# Patient Record
Sex: Female | Born: 1980 | Race: White | Hispanic: No | Marital: Married | Smoking: Never smoker
Health system: Southern US, Community
[De-identification: ages and names within clinical notes are randomized; demographics above are authoritative.]

## PROBLEM LIST (undated history)

## (undated) DIAGNOSIS — B999 Unspecified infectious disease: Secondary | ICD-10-CM

## (undated) DIAGNOSIS — A491 Streptococcal infection, unspecified site: Secondary | ICD-10-CM

## (undated) DIAGNOSIS — R51 Headache: Secondary | ICD-10-CM

## (undated) HISTORY — DX: Unspecified infectious disease: B99.9

## (undated) HISTORY — DX: Headache: R51

## (undated) HISTORY — DX: Streptococcal infection, unspecified site: A49.1

---

## 2010-03-30 HISTORY — PX: WISDOM TOOTH EXTRACTION: SHX21

## 2012-01-01 ENCOUNTER — Ambulatory Visit (INDEPENDENT_AMBULATORY_CARE_PROVIDER_SITE_OTHER): Admitting: Obstetrics and Gynecology

## 2012-01-01 ENCOUNTER — Encounter: Payer: Self-pay | Admitting: Obstetrics and Gynecology

## 2012-01-01 VITALS — BP 104/80 | HR 76 | Ht 63.0 in | Wt 129.0 lb

## 2012-01-01 DIAGNOSIS — O26859 Spotting complicating pregnancy, unspecified trimester: Secondary | ICD-10-CM

## 2012-01-01 DIAGNOSIS — O26851 Spotting complicating pregnancy, first trimester: Secondary | ICD-10-CM

## 2012-01-01 DIAGNOSIS — Z331 Pregnant state, incidental: Secondary | ICD-10-CM

## 2012-01-01 DIAGNOSIS — O209 Hemorrhage in early pregnancy, unspecified: Secondary | ICD-10-CM

## 2012-01-01 LAB — POCT URINALYSIS DIPSTICK
Bilirubin, UA: NEGATIVE
Blood, UA: NEGATIVE
Glucose, UA: NEGATIVE
Nitrite, UA: NEGATIVE
Spec Grav, UA: 1.015
Urobilinogen, UA: NEGATIVE

## 2012-01-01 LAB — POCT URINE PREGNANCY: Preg Test, Ur: POSITIVE

## 2012-01-01 NOTE — Progress Notes (Signed)
31 YO 10w 6 d pregnant by dates presented for interview today and gave a report of bleeding a few weeks ago then again last week.  Bled x 3 days each time requiring pad change daily but denies cramps.  Denies dyspareunia, urinary tract or  bowel symptoms. Blood Type A +.   O: Abdomen: soft, non-tender,  FHR=160 bpm      Pelvic:patient refuses until after she has an ultrasound  A:  Bleeding in early pregnancy      Blood type A +  P:  OB U/S < 14 weeks       Prenatal panel-pending        Nothing in vagina until there has been no bleeding x 7 days       RTO- 01/08/12 NOB and U/S if possible  Audrey Acoff, PA-C

## 2012-01-01 NOTE — Progress Notes (Signed)
When did bleeding start: 3 weeks ago How  Long: on/off within the three weeks, pt is  not bleeding today. How often changing pad/tampon: whe pt did have bleeding have bleeding it was once a day. Bleeding Disorders: no Cramping: no Contraception: no Fibroids: no Hormone Therapy: no New Medications: no Menopausal Symptoms: no Vag. Discharge: yes Abdominal Pain: no Increased Stress: yes

## 2012-01-01 NOTE — Progress Notes (Signed)
NOB interview completed.  Pt states for past 3 wks has noticed spotting/bleeding off/on.  Last instance was this past weekend, denies any spotting/bleeding today and denies any pain/cramping.  Pt scheduled for eval today w/ EP.  Quantitative HCG added to pt's prenatal labs today per EP.  Pt will return @ 2pm.

## 2012-01-02 LAB — PRENATAL PANEL VII
Antibody Screen: NEGATIVE
Basophils Absolute: 0 10*3/uL (ref 0.0–0.1)
Basophils Relative: 0 % (ref 0–1)
Eosinophils Absolute: 0.1 10*3/uL (ref 0.0–0.7)
Eosinophils Relative: 1 % (ref 0–5)
HCT: 41.2 % (ref 36.0–46.0)
Hemoglobin: 14.2 g/dL (ref 12.0–15.0)
MCH: 32.2 pg (ref 26.0–34.0)
MCHC: 34.5 g/dL (ref 30.0–36.0)
MCV: 93.4 fL (ref 78.0–100.0)
Monocytes Absolute: 0.6 10*3/uL (ref 0.1–1.0)
Monocytes Relative: 6 % (ref 3–12)
RDW: 12.4 % (ref 11.5–15.5)
Rh Type: POSITIVE

## 2012-01-02 LAB — HCG, QUANTITATIVE, PREGNANCY: hCG, Beta Chain, Quant, S: 137060.9 m[IU]/mL

## 2012-01-03 LAB — HEMOGLOBINOPATHY EVALUATION
Hgb A2 Quant: 2.8 % (ref 2.2–3.2)
Hgb F Quant: 0 % (ref 0.0–2.0)

## 2012-01-03 LAB — CULTURE, OB URINE: Colony Count: NO GROWTH

## 2012-01-08 ENCOUNTER — Ambulatory Visit (INDEPENDENT_AMBULATORY_CARE_PROVIDER_SITE_OTHER): Payer: Commercial Indemnity

## 2012-01-08 ENCOUNTER — Encounter: Payer: Self-pay | Admitting: Obstetrics and Gynecology

## 2012-01-08 ENCOUNTER — Ambulatory Visit (INDEPENDENT_AMBULATORY_CARE_PROVIDER_SITE_OTHER): Payer: Commercial Indemnity | Admitting: Obstetrics and Gynecology

## 2012-01-08 VITALS — BP 100/60 | Wt 131.0 lb

## 2012-01-08 DIAGNOSIS — Z331 Pregnant state, incidental: Secondary | ICD-10-CM

## 2012-01-08 DIAGNOSIS — O209 Hemorrhage in early pregnancy, unspecified: Secondary | ICD-10-CM

## 2012-01-08 LAB — US OB COMP LESS 14 WKS

## 2012-01-08 NOTE — Progress Notes (Signed)
11w 5d  Transabdomenal images of gravid uterus. Single IUP. +FHTs. Variable presentation. Normal fluid.  Measurements c/w LMP GA. No evidence of SCH.  CX closed. Normal ovaries/adnexa.  LAST PAP: 2011 Pt declines genetic testing.  Pt states she has no concerns today.  Pt stated she will not have any internal examination today.

## 2012-01-08 NOTE — Progress Notes (Signed)
Patient ID: Audrey Fox, female   DOB: Jun 27, 1980, 31 y.o.   MRN: [id number] Audrey Fox is a 31 y.o. female presenting for new ob visit. Certain of LMP that agrees with [redacted]w[redacted]d [place]. Vaginal spotting resolved. Refused pap always WNL last pap 2011 will send for results. Refused pelvic exam GC/CHL vag and urine lab. @MED  @IPILAPH @ OB History    Grav Para Term Preterm Abortions TAB SAB Ect Mult Living   8 5 5  2  2   5      Past Medical History  Diagnosis Date  . Infection     Yeast;not frequent  . Infection     UTI x 1  . Infection     sinus;frequent as a teen;none recently  . Headache     Sinus Headaches  . GBS (group B streptococcus) infection     with 2 pregnancies  HX of pp hemorrhage with manual removal of placenta no blood transfusion Past Surgical History  Procedure Date  . Wisdom tooth extraction 03/2010    All 4 removed   Family History: family history includes Cancer in her maternal aunt, maternal grandmother, maternal uncle, and paternal aunt; Hypertension in her maternal grandfather; and Other in her mother. Social History:  reports that she has never smoked. She has never used smokeless tobacco. She reports that she drinks about .5 ounces of alcohol per week. She reports that she does not use illicit drugs.  @ROS @    Blood pressure 100/60, weight 131 lb (59.421 kg), last menstrual period 10/17/2011, unknown if currently breastfeeding. Physical exam: Calm, no distress, HEENT wnl lungs clear bilaterally,  Breasts bukaterally no masses, dimpling or drainage. AP RRR, abd soft, gravid, nt, bowel sounds active, abdomen nontender, Fundal height nonpalpable DTR +2 no clonus No edema to lower extremities  Prenatal labs: ABO, Rh: A/POS/-- (09/03 1125) Antibody: NEG (09/03 1125) Rubella:  Immune RPR: NON REAC (09/03 1125)  HBsAg: NEGATIVE (09/03 1125)  HIV: NON REACTIVE (09/03 1125)  GBS:   na  Assessment/Plan: [redacted]w[redacted]d GC/CHL declined WET PREP declined PAP sent for  results1 201 ULTRASOUND today see documentation Genetic testing declined. Discussed rationale for testing for infection and pelvic exam declines. Collaboration with Dr. Brantley Fling, [facility] 01/08/2012, 11:39 PM Lavera Guise, CNM

## 2012-02-06 ENCOUNTER — Ambulatory Visit (INDEPENDENT_AMBULATORY_CARE_PROVIDER_SITE_OTHER): Payer: Commercial Indemnity | Admitting: Obstetrics and Gynecology

## 2012-02-06 ENCOUNTER — Encounter: Payer: Self-pay | Admitting: Obstetrics and Gynecology

## 2012-02-06 VITALS — BP 102/60 | Wt 137.0 lb

## 2012-02-06 DIAGNOSIS — Z331 Pregnant state, incidental: Secondary | ICD-10-CM

## 2012-02-06 NOTE — Progress Notes (Signed)
[redacted]w[redacted]d Declines pelvic exam.

## 2012-02-27 ENCOUNTER — Other Ambulatory Visit: Payer: Self-pay

## 2012-02-27 DIAGNOSIS — Z3689 Encounter for other specified antenatal screening: Secondary | ICD-10-CM

## 2012-02-28 ENCOUNTER — Ambulatory Visit (INDEPENDENT_AMBULATORY_CARE_PROVIDER_SITE_OTHER): Payer: Commercial Indemnity | Admitting: Obstetrics and Gynecology

## 2012-02-28 ENCOUNTER — Encounter: Payer: Self-pay | Admitting: Obstetrics and Gynecology

## 2012-02-28 ENCOUNTER — Ambulatory Visit (INDEPENDENT_AMBULATORY_CARE_PROVIDER_SITE_OTHER): Payer: Commercial Indemnity

## 2012-02-28 DIAGNOSIS — Z3689 Encounter for other specified antenatal screening: Secondary | ICD-10-CM

## 2012-02-28 DIAGNOSIS — O43129 Velamentous insertion of umbilical cord, unspecified trimester: Secondary | ICD-10-CM | POA: Insufficient documentation

## 2012-02-28 DIAGNOSIS — O283 Abnormal ultrasonic finding on antenatal screening of mother: Secondary | ICD-10-CM

## 2012-02-28 DIAGNOSIS — Z331 Pregnant state, incidental: Secondary | ICD-10-CM

## 2012-02-28 DIAGNOSIS — O289 Unspecified abnormal findings on antenatal screening of mother: Secondary | ICD-10-CM

## 2012-02-28 DIAGNOSIS — O008 Other ectopic pregnancy without intrauterine pregnancy: Secondary | ICD-10-CM

## 2012-02-28 LAB — US OB COMP + 14 WK

## 2012-02-28 NOTE — Progress Notes (Signed)
[redacted]w[redacted]d Anatomy u/s today XY EFW 36%tile Posterior fundal placenta Velamentous cord insertion 2 VC L umbilical artery present NL fluid 5.8 cm L ventricle echogenic focus EIF F/u 26 wks  Declines flu shot today

## 2012-02-28 NOTE — Progress Notes (Signed)
Doing well. Reviewed all US findings--LVEIF, 2VC, velamentous insertion, with implications and assessment discussed. Plan [place] q 4 weeks starting at 24-26 weeks. Declines genetic testing.

## 2012-03-04 ENCOUNTER — Other Ambulatory Visit: Payer: Self-pay | Admitting: Obstetrics and Gynecology

## 2012-03-04 ENCOUNTER — Telehealth: Payer: Self-pay

## 2012-03-04 DIAGNOSIS — R9389 Abnormal findings on diagnostic imaging of other specified body structures: Secondary | ICD-10-CM

## 2012-03-04 DIAGNOSIS — O43129 Velamentous insertion of umbilical cord, unspecified trimester: Secondary | ICD-10-CM

## 2012-03-04 NOTE — Telephone Encounter (Signed)
Spoke with pt informed appt time pt voice understanding

## 2012-03-04 NOTE — Telephone Encounter (Signed)
Lm on vm tcb rgd mfm referral

## 2012-03-04 NOTE — Telephone Encounter (Signed)
Message copied by Rolla Plate on Tue Mar 04, 2012  3:05 PM ------      Message from: Jaymes Graff      Created: Mon Mar 03, 2012  8:15 PM       Pt found to have 2vc, velamentous cord insertion and EIF  Please call the pt and offer a MFM consult because of all the findings.

## 2012-03-04 NOTE — Telephone Encounter (Signed)
Lm on vm tcb rgd appt pt has appt at [facility] 03/12/12 at 9:30

## 2012-03-07 ENCOUNTER — Encounter (HOSPITAL_COMMUNITY): Payer: Self-pay | Admitting: Obstetrics and Gynecology

## 2012-03-12 ENCOUNTER — Ambulatory Visit (HOSPITAL_COMMUNITY)
Admission: RE | Admit: 2012-03-12 | Discharge: 2012-03-12 | Disposition: A | Discharge status: Home / Self Care | Payer: Managed Care, Other (non HMO) | Source: Ambulatory Visit | Attending: Obstetrics and Gynecology | Admitting: Obstetrics and Gynecology

## 2012-03-12 VITALS — BP 110/74 | HR 79 | Wt 148.0 lb

## 2012-03-12 DIAGNOSIS — O283 Abnormal ultrasonic finding on antenatal screening of mother: Secondary | ICD-10-CM

## 2012-03-12 DIAGNOSIS — O358XX0 Maternal care for other (suspected) fetal abnormality and damage, not applicable or unspecified: Secondary | ICD-10-CM | POA: Insufficient documentation

## 2012-03-12 DIAGNOSIS — Z363 Encounter for antenatal screening for malformations: Secondary | ICD-10-CM | POA: Insufficient documentation

## 2012-03-12 DIAGNOSIS — Z1389 Encounter for screening for other disorder: Secondary | ICD-10-CM | POA: Insufficient documentation

## 2012-03-12 DIAGNOSIS — O43129 Velamentous insertion of umbilical cord, unspecified trimester: Secondary | ICD-10-CM

## 2012-03-12 DIAGNOSIS — R9389 Abnormal findings on diagnostic imaging of other specified body structures: Secondary | ICD-10-CM

## 2012-03-12 NOTE — Progress Notes (Signed)
Christoper Fabian  was seen today for an ultrasound appointment.  See full report in [facility]-OB/GYN.  Alpha Gula, MD  Comments: Ms. Saladin was seen today due to suspected single umbilical artery, ? velamentous cord insertion and echogenic intracardiac focus on outside ultrasound.  A single umbilical artery is appreciated; however, an echogenic intracardiac focus is not noted on today's study.  Additionally, there appears to be a marginal vs. velamentous cord insertion.  Findings and limitations of the study were discussed with the patient.  Single umbilical artery is the most common congenital abnormality.  As an isolated finding, fetal outcomes are usually excellent.  Single umbilical artery may be associated with fetal heart and renal anomalies, both which appear normal today.  The patient was offerred fetal echo, but declined further evaluation at this time.  Impression: Single IUP at 20 3/7 weeks Isolated single umbilical artery is noted The remainder of the fetal anatomy appears normal No markers associated with aneuploidy were appreciated Normal amniotic fluid volume  A marginal vs. velamentous cord is noted.  Recommendations: Recommend follow up ultrasound for growth in 6 weeks.

## 2012-03-25 ENCOUNTER — Encounter: Payer: Self-pay | Admitting: Obstetrics and Gynecology

## 2012-03-25 ENCOUNTER — Ambulatory Visit (INDEPENDENT_AMBULATORY_CARE_PROVIDER_SITE_OTHER): Payer: Commercial Indemnity | Admitting: Obstetrics and Gynecology

## 2012-03-25 VITALS — BP 90/58 | Wt 149.0 lb

## 2012-03-25 DIAGNOSIS — O283 Abnormal ultrasonic finding on antenatal screening of mother: Secondary | ICD-10-CM

## 2012-03-25 DIAGNOSIS — O289 Unspecified abnormal findings on antenatal screening of mother: Secondary | ICD-10-CM

## 2012-03-25 DIAGNOSIS — Z331 Pregnant state, incidental: Secondary | ICD-10-CM

## 2012-03-25 NOTE — Progress Notes (Signed)
[redacted]w[redacted]d Pt has no c/o [place] per MFM, EIF resolved, 2VC noted, and marginal vs. Velamentous cord insertion Recommendation f/u growth in 6wks, pt has appt scheduled w MFM next month rv'd PTL sx's RTO 4wks, 1hr gtt NV

## 2012-03-25 NOTE — Progress Notes (Signed)
[redacted]w[redacted]d U/S completed 11/13 for ? velamentous insertion cord No concerns today per pt  Unable to void

## 2012-04-18 ENCOUNTER — Ambulatory Visit (HOSPITAL_COMMUNITY)
Admission: RE | Admit: 2012-04-18 | Discharge: 2012-04-18 | Disposition: A | Discharge status: Home / Self Care | Payer: Managed Care, Other (non HMO) | Source: Ambulatory Visit | Attending: Obstetrics and Gynecology | Admitting: Obstetrics and Gynecology

## 2012-04-18 DIAGNOSIS — O283 Abnormal ultrasonic finding on antenatal screening of mother: Secondary | ICD-10-CM

## 2012-04-18 DIAGNOSIS — O358XX0 Maternal care for other (suspected) fetal abnormality and damage, not applicable or unspecified: Secondary | ICD-10-CM | POA: Insufficient documentation

## 2012-04-18 DIAGNOSIS — O43129 Velamentous insertion of umbilical cord, unspecified trimester: Secondary | ICD-10-CM

## 2012-04-18 DIAGNOSIS — IMO0001 Reserved for inherently not codable concepts without codable children: Secondary | ICD-10-CM | POA: Insufficient documentation

## 2012-04-18 DIAGNOSIS — Z3689 Encounter for other specified antenatal screening: Secondary | ICD-10-CM | POA: Insufficient documentation

## 2012-05-15 ENCOUNTER — Other Ambulatory Visit: Payer: Self-pay | Admitting: Obstetrics and Gynecology

## 2012-05-15 DIAGNOSIS — IMO0002 Reserved for concepts with insufficient information to code with codable children: Secondary | ICD-10-CM

## 2012-05-16 ENCOUNTER — Ambulatory Visit (HOSPITAL_COMMUNITY)
Admission: RE | Admit: 2012-05-16 | Discharge: 2012-05-16 | Disposition: A | Discharge status: Home / Self Care | Payer: Managed Care, Other (non HMO) | Source: Ambulatory Visit | Attending: Obstetrics and Gynecology | Admitting: Obstetrics and Gynecology

## 2012-05-16 DIAGNOSIS — IMO0002 Reserved for concepts with insufficient information to code with codable children: Secondary | ICD-10-CM

## 2012-05-16 DIAGNOSIS — Q897 Multiple congenital malformations, not elsewhere classified: Secondary | ICD-10-CM | POA: Insufficient documentation

## 2012-05-16 DIAGNOSIS — O289 Unspecified abnormal findings on antenatal screening of mother: Secondary | ICD-10-CM | POA: Insufficient documentation

## 2012-05-16 DIAGNOSIS — O43129 Velamentous insertion of umbilical cord, unspecified trimester: Secondary | ICD-10-CM

## 2012-05-16 DIAGNOSIS — O283 Abnormal ultrasonic finding on antenatal screening of mother: Secondary | ICD-10-CM

## 2012-05-22 ENCOUNTER — Ambulatory Visit: Payer: Commercial Indemnity | Admitting: Obstetrics and Gynecology

## 2012-05-22 ENCOUNTER — Encounter: Payer: Self-pay | Admitting: Obstetrics and Gynecology

## 2012-05-22 VITALS — BP 100/62 | Wt 161.0 lb

## 2012-05-22 DIAGNOSIS — Z331 Pregnant state, incidental: Secondary | ICD-10-CM

## 2012-05-22 NOTE — Progress Notes (Signed)
[redacted]w[redacted]d  Pt declines 1 GTT today.  Pt states she cannot leave sample at this time.

## 2012-05-22 NOTE — Progress Notes (Signed)
Patient ID: Audrey Fox, female   DOB: [birth year], 32 y.o.   MRN: [id number] See MFM US WNL for f/o in 5 weeks Denies 1 gtt and CBG at home, hgb and RPR today Reviewed s/sto report preterm labor if 6 contractions in 1 hour after po fluids,  rest and frequent voids, srom, vag bleeding, daily kick counts to report,  encouraged 8 water daily and frequent voids. Lavera Guise, CNM

## 2012-06-18 ENCOUNTER — Other Ambulatory Visit: Payer: Self-pay | Admitting: Obstetrics and Gynecology

## 2012-06-20 ENCOUNTER — Ambulatory Visit (HOSPITAL_COMMUNITY)
Admission: RE | Admit: 2012-06-20 | Discharge: 2012-06-20 | Disposition: A | Discharge status: Home / Self Care | Payer: Managed Care, Other (non HMO) | Source: Ambulatory Visit | Attending: Obstetrics and Gynecology | Admitting: Obstetrics and Gynecology

## 2012-06-20 DIAGNOSIS — IMO0001 Reserved for inherently not codable concepts without codable children: Secondary | ICD-10-CM | POA: Insufficient documentation

## 2012-06-20 DIAGNOSIS — Z3689 Encounter for other specified antenatal screening: Secondary | ICD-10-CM | POA: Insufficient documentation

## 2012-06-20 NOTE — Progress Notes (Signed)
[facility]  Indication: 32 yr old [id number] at [redacted]w[redacted]d with fetus with single umbilical artery and velamentous cord insertion for fetal growth.  Findings: 1. Single intrauterine pregnancy. 2. Estimated fetal weight is in the 39th%. 3. Fundal placenta without evidence of previa. 4. Normal amniotic fluid index. 5. The limited anatomy survey is normal.  Recommendations: 1. Appropriate fetal growth. 2. Velamentous cord insertion: - previously counseled - appropriate fetal growth - recommend fetal growth in 4 weeks - recommend caution at time of delivery 3. Single umbilical artery: - previously counseled - recommend fetal growth as above - declined fetal echocardiogram; fetal heart appeared normal at the time of the anatomic survey 4. Recommend follow up in 4 weeks  Eulis Foster, MD

## 2012-06-25 ENCOUNTER — Ambulatory Visit: Payer: Commercial Indemnity | Admitting: Obstetrics and Gynecology

## 2012-06-25 VITALS — BP 100/60 | Wt 165.0 lb

## 2012-06-25 NOTE — Progress Notes (Signed)
[redacted]w[redacted]d Pt voided in the lobby while waiting  No complaints per pt

## 2012-06-25 NOTE — Progress Notes (Signed)
Doing well, but feeling the pressure in her pelvis. GBS today.  Declines VE. Has continued to decline 1 hour GTT and any monitoring of CBGs. Reviewed risk of LGA infant, blood sugar instability of newborn if GDM present, etc. Patient seems to understand these risks, continues to decline GDM evaluation. Followed at MFM for velamentous insertion of cord and 2vc--monitoring fetal movements. [place] at MFM on 06/16/12: Findings:  1. Single intrauterine pregnancy.  2. Estimated fetal weight is in the 39th%.  3. Fundal placenta without evidence of previa.  4. Normal amniotic fluid index.  5. The limited anatomy survey is normal.  Recommendations:  1. Appropriate fetal growth.  2. Velamentous cord insertion:  - previously counseled  - appropriate fetal growth  - recommend fetal growth in 4 weeks  - recommend caution at time of delivery  3. Single umbilical artery:  - previously counseled  - recommend fetal growth as above  - declined fetal echocardiogram; fetal heart appeared normal at the time of the anatomic survey  4. Recommend follow up in 4 weeks  Eulis Foster, MD Plan:  F/U with MFM on 3/28 as scheduled for repeat [place]. Planning waterbirth, ordering Oasis tub today.

## 2012-07-03 ENCOUNTER — Encounter: Payer: Commercial Indemnity | Admitting: Family Medicine

## 2012-07-25 ENCOUNTER — Other Ambulatory Visit (HOSPITAL_COMMUNITY): Payer: Self-pay | Admitting: Obstetrics and Gynecology

## 2012-07-25 ENCOUNTER — Ambulatory Visit (HOSPITAL_COMMUNITY)
Admission: RE | Admit: 2012-07-25 | Discharge: 2012-07-25 | Disposition: A | Discharge status: Home / Self Care | Payer: Managed Care, Other (non HMO) | Source: Ambulatory Visit | Attending: Obstetrics and Gynecology | Admitting: Obstetrics and Gynecology

## 2012-07-25 DIAGNOSIS — IMO0001 Reserved for inherently not codable concepts without codable children: Secondary | ICD-10-CM | POA: Insufficient documentation

## 2012-07-25 DIAGNOSIS — Z3689 Encounter for other specified antenatal screening: Secondary | ICD-10-CM | POA: Insufficient documentation

## 2012-07-25 DIAGNOSIS — O43129 Velamentous insertion of umbilical cord, unspecified trimester: Secondary | ICD-10-CM

## 2012-07-25 NOTE — Progress Notes (Signed)
Audrey Fox was seen for ultrasound appointment today.  Please see AS-OBGYN report for details.

## 2012-07-29 ENCOUNTER — Other Ambulatory Visit: Payer: Self-pay | Admitting: Obstetrics and Gynecology

## 2012-07-29 DIAGNOSIS — IMO0001 Reserved for inherently not codable concepts without codable children: Secondary | ICD-10-CM

## 2012-08-01 ENCOUNTER — Other Ambulatory Visit: Payer: Self-pay | Admitting: Obstetrics and Gynecology

## 2012-08-01 ENCOUNTER — Ambulatory Visit (HOSPITAL_COMMUNITY)
Admission: RE | Admit: 2012-08-01 | Discharge: 2012-08-01 | Disposition: A | Discharge status: Home / Self Care | Payer: Managed Care, Other (non HMO) | Source: Ambulatory Visit | Attending: Obstetrics and Gynecology | Admitting: Obstetrics and Gynecology

## 2012-08-01 DIAGNOSIS — IMO0001 Reserved for inherently not codable concepts without codable children: Secondary | ICD-10-CM

## 2012-08-01 DIAGNOSIS — O480 Post-term pregnancy: Secondary | ICD-10-CM

## 2012-08-01 NOTE — Progress Notes (Signed)
Audrey Fox  was seen today for an ultrasound appointment.  See full report in [facility]-OB/GYN.  Impression: Single IUP at 41 2/7 weeks Single umbilical artery, velamentous cord insertion BPP 8/8 Normal amniotic fluid volume  Recommendations: Recommend delivery - the patient would prefer waiting until 42 weeks. Contacted Nigel Bridgeman, CNM - tentatively plans induction of labor next week.  If she does not undergo induction of labor on 4/7, plan BPP that day and induction of labor at 42 weeks (next Wednesday)  Alpha Gula, MD

## 2012-08-01 NOTE — Progress Notes (Signed)
Received call from Dr. Claudean Severance in MFM--patient there for BPP, now 41 2/7 weeks. He is concerned that patient doesn't have a plan for induction.  She has had [place] for growth there on 3/28 and for BPP today due to velamentous insertion of cord, 2VC. Upon review of EPIC chart--patient has not been seen in our office since 06/25/12, at 36 weeks.  GBS negative. [place] for growth 3/28:  EFW 6+10, 24%ile; AFI 15.73, 69%ile, vtx, velamentous insertion and 2VC noted. Was scheduled for BPP today due to postdates.  When asked why she did not f/u at [facility] since 2/26, she advised she did not want to see any provider other than a midwife and could not get an appt with a CNM since her last visit.  I reminded her there were other providers available to see her during that time.  She denies any issues during the lapse in her care and reports +FM.  Re:  Induction--she is agreeable to induction at 42 weeks, but wants me to be the provider for that experience and declines VE at present.  After a lengthy discussion, and review of [facility] induction calendar, the plan was made for her to come to the hospital on Monday, 08/04/12, at 7:30am for induction.  If she elects to defer induction until full 42 weeks, she will notify me on Monday so that we can schedule a BPP that day, and revise the plan.  MFM staff put patient on their schedule for tentative BPP appt for that day.  I will call and cancel that appt if patient elects to proceed with induction on Monday as planned.  R&B of induction reviewed, as well as the R&B of delaying induction to 42 weeks.    Patient to call prior to Monday with any issues with FM, s/s of labor, or any other issues.  Nigel Bridgeman, CNM 08/01/12 1:50pm

## 2012-08-02 ENCOUNTER — Encounter (HOSPITAL_COMMUNITY): Payer: Self-pay | Admitting: Anesthesiology

## 2012-08-02 ENCOUNTER — Inpatient Hospital Stay (HOSPITAL_COMMUNITY): Payer: Managed Care, Other (non HMO) | Admitting: Anesthesiology

## 2012-08-02 ENCOUNTER — Inpatient Hospital Stay (HOSPITAL_COMMUNITY)
Admission: AD | Admit: 2012-08-02 | Discharge: 2012-08-05 | DRG: 766 | Disposition: A | Discharge status: Home / Self Care | Payer: Managed Care, Other (non HMO) | Source: Ambulatory Visit | Attending: Obstetrics and Gynecology | Admitting: Obstetrics and Gynecology

## 2012-08-02 ENCOUNTER — Encounter (HOSPITAL_COMMUNITY): Payer: Self-pay | Admitting: Family Medicine

## 2012-08-02 ENCOUNTER — Encounter (HOSPITAL_COMMUNITY)
Admission: AD | Disposition: A | Discharge status: Home / Self Care | Payer: Self-pay | Source: Ambulatory Visit | Attending: Obstetrics and Gynecology

## 2012-08-02 DIAGNOSIS — Z98891 History of uterine scar from previous surgery: Secondary | ICD-10-CM

## 2012-08-02 DIAGNOSIS — O43129 Velamentous insertion of umbilical cord, unspecified trimester: Secondary | ICD-10-CM

## 2012-08-02 DIAGNOSIS — Z641 Problems related to multiparity: Secondary | ICD-10-CM

## 2012-08-02 DIAGNOSIS — O283 Abnormal ultrasonic finding on antenatal screening of mother: Secondary | ICD-10-CM

## 2012-08-02 LAB — CBC
MCH: 34.2 pg — ABNORMAL HIGH (ref 26.0–34.0)
Platelets: 146 10*3/uL — ABNORMAL LOW (ref 150–400)
RBC: 4.15 MIL/uL (ref 3.87–5.11)

## 2012-08-02 LAB — POCT FERN TEST: POCT Fern Test: POSITIVE

## 2012-08-02 SURGERY — Surgical Case
Anesthesia: General | Site: Abdomen | Wound class: Clean Contaminated

## 2012-08-02 MED ORDER — CEFAZOLIN SODIUM-DEXTROSE 2-3 GM-% IV SOLR
INTRAVENOUS | Status: AC
  Filled 2012-08-02: qty 50

## 2012-08-02 MED ORDER — IBUPROFEN 600 MG PO TABS: 600.0000 mg | ORAL_TABLET | Freq: Four times a day (QID) | ORAL | Status: DC | PRN

## 2012-08-02 MED ORDER — PROPOFOL 10 MG/ML IV EMUL
INTRAVENOUS | Status: DC | PRN
  Administered 2012-08-02: 200 mg via INTRAVENOUS

## 2012-08-02 MED ORDER — FENTANYL CITRATE 0.05 MG/ML IJ SOLN
INTRAMUSCULAR | Status: DC | PRN
  Administered 2012-08-02: 250 ug via INTRAVENOUS

## 2012-08-02 MED ORDER — LACTATED RINGERS IV SOLN
500.0000 mL | INTRAVENOUS | Status: DC | PRN
  Administered 2012-08-02: 500 mL via INTRAVENOUS

## 2012-08-02 MED ORDER — FENTANYL CITRATE 0.05 MG/ML IJ SOLN
INTRAMUSCULAR | Status: AC
  Filled 2012-08-02: qty 5

## 2012-08-02 MED ORDER — OXYTOCIN 40 UNITS IN LACTATED RINGERS INFUSION - SIMPLE MED: 62.5000 mL/h | INTRAVENOUS | Status: DC

## 2012-08-02 MED ORDER — LACTATED RINGERS IV SOLN
INTRAVENOUS | Status: DC | PRN
  Administered 2012-08-02: 21:00:00 via INTRAVENOUS

## 2012-08-02 MED ORDER — KETOROLAC TROMETHAMINE 30 MG/ML IJ SOLN
INTRAMUSCULAR | Status: AC
  Filled 2012-08-02: qty 1

## 2012-08-02 MED ORDER — OXYTOCIN BOLUS FROM INFUSION: 500.0000 mL | INTRAVENOUS | Status: DC

## 2012-08-02 MED ORDER — PROPOFOL 10 MG/ML IV EMUL
INTRAVENOUS | Status: AC
  Filled 2012-08-02: qty 20

## 2012-08-02 MED ORDER — HYDROMORPHONE HCL PF 1 MG/ML IJ SOLN
INTRAMUSCULAR | Status: AC
  Filled 2012-08-02: qty 1

## 2012-08-02 MED ORDER — OXYCODONE-ACETAMINOPHEN 5-325 MG PO TABS: 1.0000 | ORAL_TABLET | ORAL | Status: DC | PRN

## 2012-08-02 MED ORDER — HYDROMORPHONE HCL PF 1 MG/ML IJ SOLN: 0.5000 mg | INTRAMUSCULAR | Status: DC | PRN

## 2012-08-02 MED ORDER — LIDOCAINE HCL (CARDIAC) 20 MG/ML IV SOLN
INTRAVENOUS | Status: AC
  Filled 2012-08-02: qty 5

## 2012-08-02 MED ORDER — OXYCODONE HCL 5 MG/5ML PO SOLN: 5.0000 mg | Freq: Once | ORAL | Status: AC | PRN

## 2012-08-02 MED ORDER — SUCCINYLCHOLINE CHLORIDE 20 MG/ML IJ SOLN
INTRAMUSCULAR | Status: AC
  Filled 2012-08-02: qty 10

## 2012-08-02 MED ORDER — OXYTOCIN 10 UNIT/ML IJ SOLN
40.0000 [IU] | INTRAVENOUS | Status: DC | PRN
  Administered 2012-08-02: 40 [IU] via INTRAVENOUS

## 2012-08-02 MED ORDER — CEFAZOLIN SODIUM-DEXTROSE 2-3 GM-% IV SOLR
INTRAVENOUS | Status: DC | PRN
  Administered 2012-08-02: 2 g via INTRAVENOUS

## 2012-08-02 MED ORDER — CALCIUM CARBONATE ANTACID 500 MG PO CHEW: 1.0000 | CHEWABLE_TABLET | Freq: Four times a day (QID) | ORAL | Status: DC | PRN

## 2012-08-02 MED ORDER — LACTATED RINGERS IV SOLN
INTRAVENOUS | Status: DC
  Administered 2012-08-02: 20:00:00 via INTRAVENOUS

## 2012-08-02 MED ORDER — NEOSTIGMINE METHYLSULFATE 1 MG/ML IJ SOLN
INTRAMUSCULAR | Status: AC
  Filled 2012-08-02: qty 1

## 2012-08-02 MED ORDER — CITRIC ACID-SODIUM CITRATE 334-500 MG/5ML PO SOLN
30.0000 mL | ORAL | Status: DC | PRN
  Administered 2012-08-02: 30 mL via ORAL
  Filled 2012-08-02 (×2): qty 15

## 2012-08-02 MED ORDER — HYDROMORPHONE 0.3 MG/ML IV SOLN
INTRAVENOUS | Status: DC
  Administered 2012-08-03: 1.2 mg via INTRAVENOUS
  Administered 2012-08-03: 0.9 mg via INTRAVENOUS
  Administered 2012-08-03 (×2): 0.3 mg via INTRAVENOUS
  Administered 2012-08-03: 1.5 mg via INTRAVENOUS
  Filled 2012-08-02: qty 25

## 2012-08-02 MED ORDER — ONDANSETRON HCL 4 MG/2ML IJ SOLN
INTRAMUSCULAR | Status: AC
  Filled 2012-08-02: qty 2

## 2012-08-02 MED ORDER — HYDROMORPHONE HCL PF 1 MG/ML IJ SOLN
INTRAMUSCULAR | Status: DC | PRN
  Administered 2012-08-02: 0.5 mg via INTRAVENOUS
  Administered 2012-08-02: 1 mg via INTRAVENOUS
  Administered 2012-08-02: 0.5 mg via INTRAVENOUS

## 2012-08-02 MED ORDER — HYDROMORPHONE HCL PF 1 MG/ML IJ SOLN
0.2500 mg | INTRAMUSCULAR | Status: DC | PRN
  Administered 2012-08-02 (×6): 0.5 mg via INTRAVENOUS

## 2012-08-02 MED ORDER — ONDANSETRON HCL 4 MG/2ML IJ SOLN: 4.0000 mg | Freq: Four times a day (QID) | INTRAMUSCULAR | Status: DC | PRN

## 2012-08-02 MED ORDER — MIDAZOLAM HCL 5 MG/5ML IJ SOLN
INTRAMUSCULAR | Status: DC | PRN
  Administered 2012-08-02 (×2): 1 mg via INTRAVENOUS

## 2012-08-02 MED ORDER — DIPHENHYDRAMINE HCL 12.5 MG/5ML PO ELIX
12.5000 mg | ORAL_SOLUTION | Freq: Four times a day (QID) | ORAL | Status: DC | PRN
  Filled 2012-08-02: qty 5

## 2012-08-02 MED ORDER — MEPERIDINE HCL 25 MG/ML IJ SOLN: 6.2500 mg | INTRAMUSCULAR | Status: DC | PRN

## 2012-08-02 MED ORDER — OXYTOCIN 40 UNITS IN LACTATED RINGERS INFUSION - SIMPLE MED
1.0000 m[IU]/min | INTRAVENOUS | Status: DC
  Filled 2012-08-02: qty 1000

## 2012-08-02 MED ORDER — ROCURONIUM BROMIDE 50 MG/5ML IV SOLN
INTRAVENOUS | Status: AC
  Filled 2012-08-02: qty 1

## 2012-08-02 MED ORDER — ACETAMINOPHEN 325 MG PO TABS
650.0000 mg | ORAL_TABLET | ORAL | Status: DC | PRN
  Administered 2012-08-02 (×2): 650 mg via ORAL
  Filled 2012-08-02 (×2): qty 2

## 2012-08-02 MED ORDER — ONDANSETRON HCL 4 MG/2ML IJ SOLN
INTRAMUSCULAR | Status: DC | PRN
  Administered 2012-08-02: 4 mg via INTRAVENOUS

## 2012-08-02 MED ORDER — PROMETHAZINE HCL 25 MG/ML IJ SOLN: 6.2500 mg | INTRAMUSCULAR | Status: DC | PRN

## 2012-08-02 MED ORDER — MIDAZOLAM HCL 2 MG/2ML IJ SOLN
INTRAMUSCULAR | Status: AC
  Filled 2012-08-02: qty 2

## 2012-08-02 MED ORDER — NALOXONE HCL 0.4 MG/ML IJ SOLN: 0.4000 mg | INTRAMUSCULAR | Status: DC | PRN

## 2012-08-02 MED ORDER — SODIUM CHLORIDE 0.9 % IJ SOLN: 9.0000 mL | INTRAMUSCULAR | Status: DC | PRN

## 2012-08-02 MED ORDER — FLEET ENEMA 7-19 GM/118ML RE ENEM: 1.0000 | ENEMA | Freq: Every day | RECTAL | Status: DC | PRN

## 2012-08-02 MED ORDER — OXYCODONE HCL 5 MG PO TABS: 5.0000 mg | ORAL_TABLET | Freq: Once | ORAL | Status: AC | PRN

## 2012-08-02 MED ORDER — ACETAMINOPHEN 10 MG/ML IV SOLN
INTRAVENOUS | Status: AC
  Filled 2012-08-02: qty 100

## 2012-08-02 MED ORDER — LIDOCAINE HCL (PF) 1 % IJ SOLN: 30.0000 mL | INTRAMUSCULAR | Status: DC | PRN

## 2012-08-02 MED ORDER — TERBUTALINE SULFATE 1 MG/ML IJ SOLN: 0.2500 mg | Freq: Once | INTRAMUSCULAR | Status: DC | PRN

## 2012-08-02 MED ORDER — ACETAMINOPHEN 10 MG/ML IV SOLN
1000.0000 mg | Freq: Once | INTRAVENOUS | Status: AC | PRN
  Administered 2012-08-02: 1000 mg via INTRAVENOUS

## 2012-08-02 MED ORDER — LACTATED RINGERS IV SOLN
INTRAVENOUS | Status: DC | PRN
  Administered 2012-08-02: 22:00:00 via INTRAVENOUS

## 2012-08-02 MED ORDER — KETOROLAC TROMETHAMINE 30 MG/ML IJ SOLN
30.0000 mg | Freq: Once | INTRAMUSCULAR | Status: AC
  Administered 2012-08-02: 30 mg via INTRAVENOUS

## 2012-08-02 MED ORDER — SUCCINYLCHOLINE CHLORIDE 20 MG/ML IJ SOLN
INTRAMUSCULAR | Status: DC | PRN
  Administered 2012-08-02: 100 mg via INTRAVENOUS

## 2012-08-02 MED ORDER — DIPHENHYDRAMINE HCL 50 MG/ML IJ SOLN: 12.5000 mg | Freq: Four times a day (QID) | INTRAMUSCULAR | Status: DC | PRN

## 2012-08-02 MED ORDER — 0.9 % SODIUM CHLORIDE (POUR BTL) OPTIME
TOPICAL | Status: DC | PRN
  Administered 2012-08-02: 1000 mL

## 2012-08-02 SURGICAL SUPPLY — 31 items
BENZOIN TINCTURE PRP APPL 2/3 (GAUZE/BANDAGES/DRESSINGS) ×4 IMPLANT
CLOTH BEACON ORANGE TIMEOUT ST (SAFETY) ×2 IMPLANT
DRAPE LG THREE QUARTER DISP (DRAPES) ×2 IMPLANT
DRSG OPSITE POSTOP 4X10 (GAUZE/BANDAGES/DRESSINGS) ×2 IMPLANT
DURAPREP 26ML APPLICATOR (WOUND CARE) IMPLANT
ELECT REM PT RETURN 9FT ADLT (ELECTROSURGICAL) ×2
ELECTRODE REM PT RTRN 9FT ADLT (ELECTROSURGICAL) ×1 IMPLANT
EXTRACTOR VACUUM M CUP 4 TUBE (SUCTIONS) IMPLANT
GLOVE BIOGEL PI IND STRL 7.0 (GLOVE) ×1 IMPLANT
GLOVE BIOGEL PI INDICATOR 7.0 (GLOVE) ×1
GLOVE ECLIPSE 7.0 STRL STRAW (GLOVE) ×4 IMPLANT
GOWN PREVENTION PLUS XLARGE (GOWN DISPOSABLE) ×2 IMPLANT
GOWN STRL REIN XL XLG (GOWN DISPOSABLE) ×4 IMPLANT
KIT ABG SYR 3ML LUER SLIP (SYRINGE) ×2 IMPLANT
NEEDLE HYPO 22GX1.5 SAFETY (NEEDLE) IMPLANT
NEEDLE HYPO 25X5/8 SAFETYGLIDE (NEEDLE) ×2 IMPLANT
NS IRRIG 1000ML POUR BTL (IV SOLUTION) ×2 IMPLANT
PACK C SECTION WH (CUSTOM PROCEDURE TRAY) ×2 IMPLANT
PAD OB MATERNITY 4.3X12.25 (PERSONAL CARE ITEMS) ×2 IMPLANT
RTRCTR C-SECT PINK 25CM LRG (MISCELLANEOUS) ×2 IMPLANT
SLEEVE SCD COMPRESS KNEE MED (MISCELLANEOUS) ×2 IMPLANT
STAPLER VISISTAT 35W (STAPLE) IMPLANT
STRIP CLOSURE SKIN 1/2X4 (GAUZE/BANDAGES/DRESSINGS) ×4 IMPLANT
SUT MNCRL AB 3-0 PS2 27 (SUTURE) ×2 IMPLANT
SUT VIC AB 0 CTX 36 (SUTURE) ×2
SUT VIC AB 0 CTX36XBRD ANBCTRL (SUTURE) ×2 IMPLANT
SUT VIC AB 1 CT1 36 (SUTURE) ×4 IMPLANT
SYR 30ML LL (SYRINGE) IMPLANT
TOWEL OR 17X24 6PK STRL BLUE (TOWEL DISPOSABLE) ×6 IMPLANT
TRAY FOLEY CATH 14FR (SET/KITS/TRAYS/PACK) ×2 IMPLANT
WATER STERILE IRR 1000ML POUR (IV SOLUTION) ×2 IMPLANT

## 2012-08-02 NOTE — Progress Notes (Signed)
Audrey Fox is a 32 y.o. [id number] at [redacted]w[redacted]d admitted for rupture of membranes  Subjective: Pt dozing at present in between contractions.  Denies any increased contraction frequency or intensity at present.  Has been walking in room as well as was in birth tub for a period of time.    Objective: FHT:  FHR: 140 bpm, variability: moderate,  accelerations:  Present,  decelerations:  Absent  FHR monitored intermittently. UC:   irregular, every 10-15 minutes SVE:  Deferred at present  Labs: Lab Results  Component Value Date   WBC 9.1 01/01/2012   HGB 13.4 05/22/2012   HCT 41.2 01/01/2012   MCV 93.4 01/01/2012   PLT 186 01/01/2012    Assessment / Plan: IUP at 41w 3d Protracted latent phase SROM x 14hrs Fetal two vessel cord and velamentous cord insertion  Labor: Prolonged latent phase Preeclampsia:  no signs or symptoms of toxicity Fetal Wellbeing:  Category I Pain Control:  Labor support without medications I/D:  n/a Anticipated MOD:  NSVD  Continue expectant management. R/B/A labor augmentation d/w pt.  She agrees to try nipple stimulation for augmentation at present.  She declines pitocin.    Nitish Roes O. 08/02/2012, 6:24 PM

## 2012-08-02 NOTE — Anesthesia Preprocedure Evaluation (Signed)
Anesthesia Evaluation  Patient identified by MRN, date of birth, ID band Patient awake    Reviewed: Allergy & Precautions, H&P , NPO status , Patient's Chart, lab work & pertinent test results  Airway Mallampati: II TM Distance: >3 FB Neck ROM: Full    Dental  (+) Dental Advisory Given and Teeth Intact   Pulmonary neg pulmonary ROS,  breath sounds clear to auscultation        Cardiovascular negative cardio ROS  Rhythm:Regular Rate:Normal     Neuro/Psych  Headaches, negative psych ROS   GI/Hepatic negative GI ROS, Neg liver ROS,   Endo/Other  negative endocrine ROS  Renal/GU negative Renal ROS     Musculoskeletal negative musculoskeletal ROS (+)   Abdominal (+) - obese,   Peds  Hematology negative hematology ROS (+)   Anesthesia Other Findings   Reproductive/Obstetrics (+) Pregnancy                           Anesthesia Physical Anesthesia Plan  ASA: II and emergent  Anesthesia Plan: General   Post-op Pain Management:    Induction: Intravenous, Rapid sequence and Cricoid pressure planned  Airway Management Planned: Oral ETT  Additional Equipment:   Intra-op Plan:   Post-operative Plan: Extubation in OR  Informed Consent: I have reviewed the patients History and Physical, chart, labs and discussed the procedure including the risks, benefits and alternatives for the proposed anesthesia with the patient or authorized representative who has indicated his/her understanding and acceptance.   Dental advisory given  Plan Discussed with: CRNA  Anesthesia Plan Comments:         Anesthesia Quick Evaluation

## 2012-08-02 NOTE — H&P (Signed)
Audrey Fox is a 32 y.o. female presenting for SROM w light MSAF at 0100, irregular mild ctx. Denies any VB, reports GFM. Pt appears grossly ruptured, +fern also noted.   HPI: Pt began PNC at [facility] at 11wks. [place] secondary to several episodes of VB, c/w LMP dating w [facility] 3/26 Anatomy scan at 19wks, LVEIF noted, 2VC, and velamentous cord insertion, Pt was then referred to MFM, f/u US at 21wks showed EIF resolved, and confirmed 2VC and velamentous cord insertion, pt declined fetal echo, or any genetic testing  [place] at 31wks - normal growth Korea at 35wks normal growth, 39% [place] at [redacted]w[redacted]d - EFW 6#10oz 24% BPP on 4/4 = 8/8  Pt has not been seen at [facility] since 36wks, (see note by V.Latham)  Pt declined IOL on 08/01/12, pt declined Vag exam   Maternal Medical History:  Reason for admission: Rupture of membranes.   Contractions: Onset was 3-5 hours ago.   Frequency: irregular.   Perceived severity is mild.    Fetal activity: Perceived fetal activity is normal.   Last perceived fetal movement was within the past hour.    Prenatal complications: Placental abnormality.   2VC, velamentous cord insertion     OB History   Grav Para Term Preterm Abortions TAB SAB Ect Mult Living   8 5 5  2  2   5      G1 - SAB - no comps G2 -SVD,  4th degree laceraation G3 - SVD no comp G4 - SVD no comp G5 - SVD - PPH, rcv'd methergine G6 - SVD - no comp  G7 - 09/21/11, SAB - no comp G8 - current preg   Past Medical History  Diagnosis Date  . Infection     Yeast;not frequent  . Infection     UTI x 1  . Infection     sinus;frequent as a teen;none recently  . Headache     Sinus Headaches  . GBS (group B streptococcus) infection     with 2 pregnancies   Past Surgical History  Procedure Laterality Date  . Wisdom tooth extraction  03/2010    All 4 removed   Family History: family history includes Cancer in her maternal aunt, maternal grandmother, maternal uncle, and paternal aunt; Hypertension in her  maternal grandfather; and Other in her mother. Social History:  reports that she has never smoked. She has never used smokeless tobacco. She reports that she drinks about 0.5 ounces of alcohol per week. She reports that she does not use illicit drugs.   Prenatal Transfer Tool  Maternal Diabetes: unknown, pt declined screening  Genetic Screening: Declined Maternal Ultrasounds/Referrals: Abnormal:  Findings:   Isolated EIF (echogenic intracardiac focus), Other: EIF - resolved, 2VC, velamentous cord insertion  Fetal Ultrasounds or other Referrals:  Referred to Materal Fetal Medicine  Maternal Substance Abuse:  No Significant Maternal Medications:  None Significant Maternal Lab Results:  Lab values include: Group B Strep negative Other Comments:  None  Review of Systems  All other systems reviewed and are negative.      Blood pressure 127/84, pulse 81, temperature 97.8 F (36.6 C), temperature source Oral, resp. rate 20, height 5\' 3"  (1.6 m), weight 170 lb (77.111 kg), last menstrual period 10/17/2011. Maternal Exam:  Uterine Assessment: Contraction strength is mild.  Contraction frequency is irregular.   Abdomen: Patient reports no abdominal tenderness. Fundal height is aga.   Estimated fetal weight is 6-10 .   Fetal presentation: vertex  Introitus: Normal  vulva. Normal vagina.  Ferning test: positive.  Amniotic fluid character: meconium stained.  Pelvis: adequate for delivery.   Cervix: Cervix evaluated by digital exam.     Fetal Exam Fetal Monitor Review: Mode: ultrasound.   Baseline rate: 140.  Variability: moderate (6-25 bpm).   Pattern: accelerations present and variable decelerations.    Fetal State Assessment: Category II - tracings are indeterminate.     Physical Exam  Nursing note and vitals reviewed. Constitutional: She is oriented to person, place, and time. She appears well-developed and well-nourished.  HENT:  Head: Normocephalic.  Eyes: Pupils are equal,  round, and reactive to light.  Neck: Normal range of motion.  Cardiovascular: Normal rate, regular rhythm and normal heart sounds.   Respiratory: Effort normal and breath sounds normal.  GI: Soft. Bowel sounds are normal.  Genitourinary: Vagina normal.  Musculoskeletal: Normal range of motion.  Neurological: She is alert and oriented to person, place, and time. She has normal reflexes.  Skin: Skin is warm and dry.  Psychiatric: She has a normal mood and affect. Her behavior is normal.    Prenatal labs: ABO, Rh: A/POS/-- (09/03 1125) Antibody: NEG (09/03 1125) Rubella: 213.1 (09/03 1125) RPR: NON REAC (01/23 0920)  HBsAg: NEGATIVE (09/03 1125)  HIV: NON REACTIVE (09/03 1125)  GBS: NEGATIVE (02/26 1403)  hgb electrophoresis - neg hgb at NOB =14.2, hgb at 31wks =13.4 3rd trim RPR = NR,  Pt declined diabetes screening  Pt declined GC/CT cultures Pt declined genetic screens  Assessment/Plan: IUP at [redacted]w[redacted]d  SROM - light MSAF Early labor GBS neg FHR overall reassuring 2VC Velamentous cord insertion Grand multiparity Lapse of PNC from 36w-41wks (only seen at MFM for growth [place] at 35wks and [redacted]w[redacted]d and [redacted]w[redacted]d)  Admit to b.s per c/w Dr Stefano Gaul Pt declines IV site or labs at present, rv'd R/B  Pt plans for WB, rv'd R/B and  Recommend CEFM     Praneel Haisley M 08/02/2012, 3:41 AM

## 2012-08-02 NOTE — Progress Notes (Signed)
Audrey Fox is a 32 y.Fox. [id number] at [redacted]w[redacted]d admitted for rupture of membranes  Subjective: Reports contraction frequency and intensity are not significantly increased at the present time.  She continues to leak small amounts of light meconium stained amniotic fluid.  She has noted some blood tinged vaginal discharge as well but no frank bleeding.  Reports fetus is moving normally. FHR monitored intermittently at present.    Objective: BP 113/68  Pulse 90  Temp(Src) 97.8 F (37.2 C) (Oral)  Resp 18  Ht 5\' 3"  (1.6 m)  Wt 170 lb (77.111 kg)  BMI 30.12 kg/m2  LMP 10/17/2011     FHT:  FHR: 140 bpm, variability: moderate,  accelerations:  Present,  decelerations:  Absent UC:   Every 3-10 mins, mild to moderate to palpation SVE:  Deferred Labs: Lab Results  Component Value Date   WBC 9.1 01/01/2012   HGB 13.4 05/22/2012   HCT 41.2 01/01/2012   MCV 93.4 01/01/2012   PLT 186 01/01/2012    Assessment / Plan: IUP at 41w 3d SROM 11hrs ago Two vessel cord and velamentous cord insertion  Labor: Latent labor Preeclampsia:  no signs or symptoms of toxicity Fetal Wellbeing:  Category I Pain Control:  Labor support without medications I/D:  n/a Anticipated MOD:  NSVD  RBA of contd expectant management as well as nipple stimulation and IV pitocin for augmentation d/w pt over 30 minutes, including increased risk of maternal and fetal infection.  At the present time she declines to initiate any methods of augmentation. She does agree to saline lock placement when she is in active labor.    Audrey Fox. 08/02/2012, 6:15 PM

## 2012-08-02 NOTE — Anesthesia Procedure Notes (Signed)
Procedure Name: Intubation Date/Time: 08/02/2012 9:27 PM Performed by: Graciela Husbands Pre-anesthesia Checklist: Patient identified, Patient being monitored, Emergency Drugs available, Timeout performed and Suction available Patient Re-evaluated:Patient Re-evaluated prior to inductionOxygen Delivery Method: Circle system utilized Preoxygenation: Pre-oxygenation with 100% oxygen Intubation Type: IV induction, Cricoid Pressure applied and Rapid sequence Grade View: Grade I Tube type: Oral Number of attempts: 1 Airway Equipment and Method: Stylet Placement Confirmation: ETT inserted through vocal cords under direct vision,  positive ETCO2 and breath sounds checked- equal and bilateral Secured at: 21 cm Tube secured with: Tape Dental Injury: Teeth and Oropharynx as per pre-operative assessment

## 2012-08-02 NOTE — Anesthesia Postprocedure Evaluation (Signed)
Anesthesia Post Note  Patient: Audrey Fox  Procedure(s) Performed: Procedure(s) (LRB): Primary CESAREAN SECTION of baby boy at 2129 APGAR 7/8/9 (N/A)  Anesthesia type: General  Patient location: PACU  Post pain: Pain level controlled  Post assessment: Post-op Vital signs reviewed  Last Vitals: BP 126/74  Pulse 62  Temp(Src) 36.4 C (Oral)  Resp 14  Ht 5\' 3"  (1.6 m)  Wt 170 lb (77.111 kg)  BMI 30.12 kg/m2  SpO2 95%  LMP 10/17/2011  Post vital signs: Reviewed  Level of consciousness: sedated  Complications: No apparent anesthesia complications

## 2012-08-02 NOTE — Progress Notes (Signed)
Pt refuses IV or Labs. Teaching done on both

## 2012-08-02 NOTE — Progress Notes (Signed)
Audrey Fox is a 32 y.o. [id number] at [redacted]w[redacted]d admitted for rupture of membranes  Subjective: Reports feeling contractions with some increased intensity at present.  States she feels better sitting on birthing ball.  Objective: BP 121/83  Pulse 74  Temp(Src) 97.8 F (36.6 C) (Oral)  Resp 20  Ht 5\' 3"  (1.6 m)  Wt 170 lb (77.111 kg)  BMI 30.12 kg/m2  LMP 10/17/2011     FHT:  FHR: 130 bpm, variability: moderate,  accelerations:  Present,  decelerations:  Absent UC:   Every 5-10 mins, mild to moderate to palpation per toco SVE:  Deferred at present. Labs: Lab Results  Component Value Date   WBC 9.1 01/01/2012   HGB 13.4 05/22/2012   HCT 41.2 01/01/2012   MCV 93.4 01/01/2012   PLT 186 01/01/2012    Assessment / Plan: IUP at 41w 3d SROM 08/02/12 @ 0100 Two vessel cord and velamentous cord insertion Desires non-interventive labor and water birth  Labor: Early labor Preeclampsia:  no signs or symptoms of toxicity Fetal Wellbeing:  Category I Pain Control:  Labor support without medications I/D:  n/a Anticipated MOD:  NSVD  Consult obtained with Dr. Estanislado Pandy regarding continuous monitoring.  OK for intermittent monitoring unless change in fetal status. R/B/A IV access related to potential for post-partum hemorrhage d/w pt and pt states she will consider request.   Pt declines venipuncture for admission labs this AM.    Xaviar Lunn O. 08/02/2012, 9:14 AM

## 2012-08-02 NOTE — OR Nursing (Signed)
25 ml blood loss during fundal massage by DLWegner RN, cord blood x 2 to OR [employer]

## 2012-08-02 NOTE — Progress Notes (Signed)
Patient ID: Audrey Fox, female   DOB: [birth year], 32 y.o.   MRN: [id number] Audrey Fox is a 32 y.o. [id number] at [redacted]w[redacted]d admitted for SROM at 0100  Subjective: Ctx about the same irregular, not too uncomfortable   Objective: BP 121/83  Pulse 74  Temp(Src) 97.8 F (36.6 C) (Oral)  Resp 20  Ht 5\' 3"  (1.6 m)  Wt 170 lb (77.111 kg)  BMI 30.12 kg/m2  LMP 10/17/2011     FHT:  FHR: 130 bpm, variability: moderate,  accelerations:  Present,  decelerations:  Present early variable UC:   irregular, every 5-8 minutes SVE:   Dilation: 2 Effacement (%): 80 Station: -2 Exam by:: S.Alayssa Flinchum,CNM  Vag exam deferred   Assessment / Plan: SROM  Labor: early labor Preeclampsia:  no signs or symptoms of toxicity Fetal Wellbeing:  Category I Pain Control:  Labor support without medications Anticipated MOD:  NSVD  Pt continues to decline lab draw or IV site Recheck cervix prn  Update physician PRN   Malissa Hippo 08/02/2012, 8:15 AM

## 2012-08-02 NOTE — Progress Notes (Signed)
Audrey Fox is a 32 y.o. [id number] at [redacted]w[redacted]d  admitted for rupture of membranes  Subjective: Reports very rare UCs at the present time.  Has tried resting at intervals.    Objective: BP 108/69  Pulse 78  Temp(Src) 99 F (37.2 C) (Oral)  Resp 18  Ht 5\' 3"  (1.6 m)  Wt 170 lb (77.111 kg)  BMI 30.12 kg/m2  LMP 10/17/2011  FHT:  FHR: 135 bpm, variability: moderate,  accelerations:  Present,  decelerations:  Absent UC:   Rare  SVE:  Deferred at present Labs: Lab Results  Component Value Date   WBC 9.1 01/01/2012   HGB 13.4 05/22/2012   HCT 41.2 01/01/2012   MCV 93.4 01/01/2012   PLT 186 01/01/2012    Assessment / Plan: IUP at 41w 3d Prolonged ROM Fetal two vessel cord/velamentous cord insertion  Labor: Prolonged latent phase Preeclampsia:  no signs or symptoms of toxicity Fetal Wellbeing:  Category I Pain Control:  Labor support without medications I/D:  n/a Anticipated MOD:  NSVD  The patient now states she is agreeable to starting pitocin per protocol for labor augmentation.  The RBA were again d/w pt.  Will begin at 37mu/min and increase by 1mu increments at pt request.   Will continue current care.    Ezzard Ditmer O. 08/02/2012, 7:23 PM

## 2012-08-02 NOTE — Progress Notes (Signed)
Audrey Fox is a 32 y.o. [id number] at [redacted]w[redacted]d admitted for rupture of membranes  Subjective: Pt reports feeling fatigued at present but states contractions are even less frequent and less intense than earlier.  States nipple stimulation that she performed did not initiate contractions as she had hoped.  Objective: BP 108/69  Pulse 78  Temp(Src) 99 F (37.2 C) (Oral)  Resp 18  Ht 5\' 3"  (1.6 m)  Wt 170 lb (77.111 kg)  BMI 30.12 kg/m2  LMP 10/17/2011   FHT:  FHR: 125 bpm, variability: moderate,  accelerations:  Present,  decelerations:  Absent UC:   irregular, every 10-15 minutes SVE: Deferred  Labs: Lab Results  Component Value Date   WBC 9.1 01/01/2012   HGB 13.4 05/22/2012   HCT 41.2 01/01/2012   MCV 93.4 01/01/2012   PLT 186 01/01/2012    Assessment / Plan: IUP at 41w 3d SROM x 16hrs Prolonged latent phase of labor Fetal two vessel cord and velamentous cord insertion  Labor: Prolonged latent phase Preeclampsia:  no signs or symptoms of toxicity Fetal Wellbeing:  Category I Pain Control:  Labor support without medications I/D:  n/a Anticipated MOD:  NSVD  RBA pitocin augmentation d/w pt and she will consider.  Would like to discuss with her family.    Audrey Fox O. 08/02/2012, 6:30 PM

## 2012-08-02 NOTE — Op Note (Signed)
Preoperative diagnosis: Intrauterine pregnancy at 41 weeks and 3 days, 2 vessel cord with velamentous insertion, non-reassuring fetal heart rate  Post operative diagnosis: Same  Anesthesia: general  Anesthesiologist: Dr. Lewie Loron  Procedure: Primary low transverse cesarean section  Surgeon: Dr. Dois Davenport Tearsa Kowalewski  Assistant: Elsie Ra CNM  Estimated blood loss: 800 cc  Procedure:  Called by CNM at home at 21:11 reporting prolonged variable decelerations without Pitocin with sudden onset not responding to change of position and O2. Cervix is 3 cm and no cord is felt. Will start amnioinfusion. At 21:13, CNM called back while I was in my car and on my way informing me that FHR was 60 bpm and she was going to OR. I requested that Dr Shawnie Pons ( on call for faculty practice)be notified to go to OR.  Arrived on L&D as the patient is going through the doors to cesarean suite. Upon entry in suite, FHR 144 briefly and then back to 80 bpm. Patient was informed that we would proceed with urgent cesarean section under general anesthesia.    The patient is rapidly prepped with Betadine and a Foley catheter is inserted in the bladder.  . She is placed in the dorsal decubitus position with the pelvis tilted to the left. She is draped in a sterile fashion. She is then given general anesthesia with endotracheal intubation without complication.    A Pfannenstiel incision is performed at 21:28 and brought down sharply to the fascia. The fascia is entered in a low transverse fashion. Linea alba is dissected. Peritoneum is entered in a midline fashion.  The myometrium is then entered in a low transverse fashion, 2 cm above the vesico-uterine junction ; first with knife and then extended bluntly. Amniotic fluid is thick meconium. We assist the birth of a female  infant in vertex presentation at 21:29. Mouth and nose are suctioned. The baby is delivered. The cord is clamped and sectioned, it had an occult prolapse on  baby's anterior shoulder. The baby is given to the neonatologist present in the room.  10 cc of blood is drawn from the umbilical vein.We are unable to draw from the single collapsed artery for cor gas so it is also drawn from the vein.The placenta is allowed to deliver spontaneously. It is complete and the cord has 2 vessels and a velamentous insertion without rupture.Marland Kitchen Uterine revision is negative.  We proceed with closure of the myometrium in 2 layers: First with a running locked suture of 0 Vicryl, then with a Lembert suture of 0 Vicryl imbricating the first one. Hemostasis is completed with cauterization on peritoneal edges.  Both paracolic gutters are cleaned. Both tubes and ovaries are assessed and normal. The pelvis is profusely irrigated with warm saline to confirm a satisfactory hemostasis.  Retractors and sponges are removed. Under fascia hemostasis is completed with cauterization. The fascia is then closed with 2 running sutures of 0 Vicryl meeting midline. The wound is irrigated with warm saline and hemostasis is completed with cauterization. The skin is closed with a subcuticular suture of 3-0 Monocryl and Steri-Strips.  Instrument and sponge count is complete x2. Estimated blood loss is 800 cc.  The procedure is well tolerated by the patient who is taken to recovery room in a well and stable condition.  female baby named Janyth Pupa was born at 21:29 and received an Apgar of 7  at 1 minute, 8 at 5 minutes and 9 at 10 minutes. Venous cord pH is 7.287   Specimen: Placenta  sent to pathology   [facility] A MD 4/5/201410:24 PM

## 2012-08-02 NOTE — Transfer of Care (Signed)
Immediate Anesthesia Transfer of Care Note  Patient: Audrey Fox  Procedure(s) Performed: Procedure(s): Primary CESAREAN SECTION of baby boy at 2129 (N/A)  Patient Location: PACU  Anesthesia Type:General  Level of Consciousness: awake, alert  and oriented  Airway & Oxygen Therapy: Patient Spontanous Breathing and Patient connected to nasal cannula oxygen  Post-op Assessment: Report given to PACU RN and Post -op Vital signs reviewed and stable  Post vital signs: Reviewed and stable  Complications: No apparent anesthesia complications

## 2012-08-02 NOTE — MAU Note (Signed)
Pt states she had a gush of fluid at 0106-green tinged in color

## 2012-08-02 NOTE — Progress Notes (Signed)
Discussed IV and Labs again. Pt states will allow saline lock when labor is active

## 2012-08-02 NOTE — Progress Notes (Signed)
CNM notified that RN cannot complete PPH protocol screening without lab results. CNM states that is fine and for RN to hold labs.

## 2012-08-03 DIAGNOSIS — Z98891 History of uterine scar from previous surgery: Secondary | ICD-10-CM

## 2012-08-03 LAB — RPR: RPR Ser Ql: NONREACTIVE

## 2012-08-03 MED ORDER — DIPHENHYDRAMINE HCL 25 MG PO CAPS: 25.0000 mg | ORAL_CAPSULE | Freq: Four times a day (QID) | ORAL | Status: DC | PRN

## 2012-08-03 MED ORDER — LANOLIN HYDROUS EX OINT: 1.0000 "application " | TOPICAL_OINTMENT | CUTANEOUS | Status: DC | PRN

## 2012-08-03 MED ORDER — METHYLERGONOVINE MALEATE 0.2 MG/ML IJ SOLN: 0.2000 mg | INTRAMUSCULAR | Status: DC | PRN

## 2012-08-03 MED ORDER — SENNOSIDES-DOCUSATE SODIUM 8.6-50 MG PO TABS
2.0000 | ORAL_TABLET | Freq: Every day | ORAL | Status: DC
  Administered 2012-08-03 – 2012-08-04 (×2): 2 via ORAL

## 2012-08-03 MED ORDER — WITCH HAZEL-GLYCERIN EX PADS: 1.0000 "application " | MEDICATED_PAD | CUTANEOUS | Status: DC | PRN

## 2012-08-03 MED ORDER — FERROUS SULFATE 325 (65 FE) MG PO TABS
325.0000 mg | ORAL_TABLET | Freq: Two times a day (BID) | ORAL | Status: DC
  Filled 2012-08-03 (×2): qty 1

## 2012-08-03 MED ORDER — SODIUM CHLORIDE 0.9 % IV SOLN
3.0000 g | Freq: Four times a day (QID) | INTRAVENOUS | Status: AC
  Administered 2012-08-03 – 2012-08-04 (×4): 3 g via INTRAVENOUS
  Filled 2012-08-03 (×4): qty 3

## 2012-08-03 MED ORDER — SIMETHICONE 80 MG PO CHEW
80.0000 mg | CHEWABLE_TABLET | ORAL | Status: DC | PRN
  Administered 2012-08-03 – 2012-08-05 (×2): 80 mg via ORAL

## 2012-08-03 MED ORDER — ONDANSETRON HCL 4 MG PO TABS
4.0000 mg | ORAL_TABLET | ORAL | Status: DC | PRN
  Administered 2012-08-03 – 2012-08-04 (×3): 4 mg via ORAL
  Filled 2012-08-03 (×4): qty 1

## 2012-08-03 MED ORDER — OXYCODONE-ACETAMINOPHEN 5-325 MG PO TABS
1.0000 | ORAL_TABLET | ORAL | Status: DC | PRN
  Administered 2012-08-03 – 2012-08-05 (×14): 1 via ORAL
  Filled 2012-08-03 (×4): qty 1
  Filled 2012-08-03: qty 2
  Filled 2012-08-03 (×8): qty 1

## 2012-08-03 MED ORDER — SIMETHICONE 80 MG PO CHEW
80.0000 mg | CHEWABLE_TABLET | Freq: Three times a day (TID) | ORAL | Status: DC
  Administered 2012-08-03 – 2012-08-05 (×8): 80 mg via ORAL

## 2012-08-03 MED ORDER — TETANUS-DIPHTH-ACELL PERTUSSIS 5-2.5-18.5 LF-MCG/0.5 IM SUSP: 0.5000 mL | Freq: Once | INTRAMUSCULAR | Status: DC

## 2012-08-03 MED ORDER — MENTHOL 3 MG MT LOZG: 1.0000 | LOZENGE | OROMUCOSAL | Status: DC | PRN

## 2012-08-03 MED ORDER — OXYTOCIN 40 UNITS IN LACTATED RINGERS INFUSION - SIMPLE MED: 62.5000 mL/h | INTRAVENOUS | Status: AC

## 2012-08-03 MED ORDER — ZOLPIDEM TARTRATE 5 MG PO TABS: 5.0000 mg | ORAL_TABLET | Freq: Every evening | ORAL | Status: DC | PRN

## 2012-08-03 MED ORDER — PRENATAL MULTIVITAMIN CH
1.0000 | ORAL_TABLET | Freq: Every day | ORAL | Status: DC
  Filled 2012-08-03 (×2): qty 1

## 2012-08-03 MED ORDER — IBUPROFEN 600 MG PO TABS
600.0000 mg | ORAL_TABLET | Freq: Four times a day (QID) | ORAL | Status: DC
  Administered 2012-08-03 – 2012-08-05 (×10): 600 mg via ORAL
  Filled 2012-08-03 (×10): qty 1

## 2012-08-03 MED ORDER — MEASLES, MUMPS & RUBELLA VAC ~~LOC~~ INJ
0.5000 mL | INJECTION | Freq: Once | SUBCUTANEOUS | Status: DC
  Filled 2012-08-03: qty 0.5

## 2012-08-03 MED ORDER — ONDANSETRON HCL 4 MG/2ML IJ SOLN: 4.0000 mg | INTRAMUSCULAR | Status: DC | PRN

## 2012-08-03 MED ORDER — DIBUCAINE 1 % RE OINT: 1.0000 "application " | TOPICAL_OINTMENT | RECTAL | Status: DC | PRN

## 2012-08-03 MED ORDER — METHYLERGONOVINE MALEATE 0.2 MG PO TABS: 0.2000 mg | ORAL_TABLET | ORAL | Status: DC | PRN

## 2012-08-03 NOTE — Progress Notes (Addendum)
Subjective: Postpartum Day 1: Cesarean Delivery Patient reports that pain is well-managed.Lochia normal. Has gotten up twice. Catheter still in place with good output.   Objective: Vital signs in last 24 hours: Temp:  [97.3 F (36.3 C)-98.2 F (36.8 C)] 97.5 F (36.4 C) (04/06 1053) Pulse Rate:  [54-81] 73 (04/06 1456) Resp:  [9-26] 15 (04/06 1427) BP: (110-136)/(62-97) 113/77 mmHg (04/06 1456) SpO2:  [92 %-99 %] 99 % (04/06 1427) FiO2 (%):  [99 %] 99 % (04/06 1633)  Physical Exam:  General: alert Lochia: appropriate Uterine Fundus: firm and appropriately tender DVT Evaluation: No evidence of DVT seen on physical exam. Edema 1+   Recent Labs  08/02/12 1825  HGB 14.2  HCT 39.7   Pt declined post-op CBC   Assessment/Plan: Status post Cesarean section. Doing well postoperatively.  Continue current care. Anticipate discharge 08/05/12 Peri-partum events and op findings reviewed with pt and husband. Questions answered.  Tomeshia Pizzi A 08/03/2012, 5:42 PM

## 2012-08-03 NOTE — Anesthesia Postprocedure Evaluation (Signed)
  Anesthesia Post-op Note  Patient: Audrey Fox  Procedure(s) Performed: Procedure(s): Primary CESAREAN SECTION of baby boy at 2129 APGAR 7/8/9 (N/A)  Patient Location: Mother/Baby  Anesthesia Type:General  Level of Consciousness: awake, alert , oriented and patient cooperative  Airway and Oxygen Therapy: Patient Spontanous Breathing and Patient connected to nasal cannula oxygen  Post-op Pain: mild  Post-op Assessment: Patient's Cardiovascular Status Stable, Respiratory Function Stable, Patent Airway, No signs of Nausea or vomiting, Adequate PO intake and Pain level controlled  Post-op Vital Signs: Reviewed and stable  Complications: No apparent anesthesia complications

## 2012-08-03 NOTE — Progress Notes (Signed)
Liliani Bobo is a 32 y.o. [id number] at [redacted]w[redacted]d admitted for rupture of membranes  Subjective: Pt with occas mild contraction but much less intense than earlier in the day.    Objective: FHT:  FHR: 120 bpm, variability: moderate,  accelerations:  Abscent,  decelerations:  Present Repetitive prolonged variable decels with contractions noted over the past 30 minutes.  Nadir 60bpm with duration of 3-4 mins.   Variable decels resolved with maternal position changes as well as IV fluid bolus and oxygen per mask.  At 2103, call placed to Dr. Estanislado Pandy regarding FHR tracing and will attempt amnioinfusion.  IUPC and DECG placed without difficulty however persistent prolonged variables noted with less recovery with maternal position changes and usual measures.  At 2112, call placed to Dr. Estanislado Pandy again regarding FHR pattern UC:   irregular, every 4-6 minutes SVE:   Dilation: 3 Effacement (%): 60 Station: -2 Exam by:: n.Chiquitta Matty,cnm No palpable cord noted on SVE.    Labs: Lab Results  Component Value Date   WBC 12.7* 08/02/2012   HGB 14.2 08/02/2012   HCT 39.7 08/02/2012   MCV 95.7 08/02/2012   PLT 146* 08/02/2012    Assessment / Plan: IUP at 41w 3d Fetal heart rate decelerations  Labor: No progress at present Preeclampsia:  no signs or symptoms of toxicity Fetal Wellbeing:  Category III Pain Control:  Labor support without medications I/D:  n/a Anticipated MOD:  C/S  Due to the FHR tracing, the RBA of C/S d/w pt including risks of increased bleeding, infection and damage to adjacent organs.  Pt agrees to proceed.   Dr. Estanislado Pandy in route to [facility] at present.  Dr. Shawnie Pons notified and present to stand by to begin C/S until Dr. Estanislado Pandy arrives.  Anesthesia and OR notified need for emergent C/S.    Shermaine Rivet O. 08/03/2012, 4:28 AM

## 2012-08-03 NOTE — Progress Notes (Signed)
Audrey Fox is a 32 y.o. G4W1027 at [redacted]w[redacted]d admitted for rupture of membranes  Subjective: Pt agreeable to starting pitocin as previously discussed.  Had wanted to rest over the past hour.  States she is very fatigued.    Objective: BP 108/69  Pulse 78  Temp(Src) 98.2 F (36.6 C) (Oral)  Resp 18  Ht 5\' 3"  (1.6 m)  Wt 170 lb (77.111 kg)  BMI 30.12 kg/m2  SpO2 97%  LMP 10/17/2011  FHT:  FHR: 142 bpm, variability: moderate,  accelerations:  Present,  decelerations:  Present Variable decel to 90 bpm x 60 secs noted just prior to SVE.  Variable resolved with position change.   UC:   Irregular every 5-10 mins, not tracing with toco, mild to palpation SVE:   Dilation: 3 Effacement (%): 60 Station: -2 Exam by:: n.Audrey Fox,cnm  Labs: Lab Results  Component Value Date   WBC 12.7* 08/02/2012   HGB 14.2 08/02/2012   HCT 39.7 08/02/2012   MCV 95.7 08/02/2012   PLT 146* 08/02/2012    Assessment / Plan: IUP at 41w 3d Prolonged latent phase of labor Fetal two vessel cord and velamentous cord insertion  Labor: Prolonged latent phase Preeclampsia:  no signs or symptoms of toxicity Fetal Wellbeing:  Category I Pain Control:  Labor support without medications I/D:  n/a Anticipated MOD:  NSVD  Will begin pitocin as ordered after reactive FHR tracing obtained.  Pt agrees to the plan.    Audrey Fox O. 08/03/2012, 4:19 AM

## 2012-08-04 ENCOUNTER — Ambulatory Visit (HOSPITAL_COMMUNITY): Payer: Managed Care, Other (non HMO)

## 2012-08-04 ENCOUNTER — Encounter: Payer: Commercial Indemnity | Admitting: Obstetrics and Gynecology

## 2012-08-04 ENCOUNTER — Encounter (HOSPITAL_COMMUNITY): Payer: Self-pay | Admitting: Obstetrics and Gynecology

## 2012-08-04 NOTE — Progress Notes (Signed)
Subjective: Postpartum Day 2: Cesarean Delivery due to [facility] under general. Patient up ad lib, reports no syncope or dizziness.  Planning d/c tomorrow. Feeding:  Breast Contraceptive plan:  Declines at present  Objective: Vital signs in last 24 hours: Temp:  [97.5 F (36.4 C)-98.6 F (37 C)] 98.6 F (37 C) (04/07 0516) Pulse Rate:  [65-91] 73 (04/07 0516) Resp:  [12-18] 18 (04/07 0516) BP: (113-132)/(77-91) 128/85 mmHg (04/07 0516) SpO2:  [97 %-99 %] 97 % (04/06 1900) FiO2 (%):  [99 %] 99 % (04/06 1633)  Physical Exam:  General: alert Lochia: appropriate Uterine Fundus: firm Incision: healing well DVT Evaluation: No evidence of DVT seen on physical exam. Negative Homan's sign. JP drain:   NA   Recent Labs  08/02/12 1825  HGB 14.2  HCT 39.7  Has declined further CBCs.  Assessment/Plan: Status post Cesarean section day 2. Doing well postoperatively.  Continue current care. Plan for discharge tomorrow    Nigel Bridgeman 08/04/2012, 8:51 AM

## 2012-08-05 DIAGNOSIS — Z98891 History of uterine scar from previous surgery: Secondary | ICD-10-CM

## 2012-08-05 MED ORDER — OXYCODONE-ACETAMINOPHEN 5-325 MG PO TABS: 1.0000 | ORAL_TABLET | ORAL | Status: DC | PRN

## 2012-08-05 MED ORDER — IBUPROFEN 600 MG PO TABS: 600.0000 mg | ORAL_TABLET | Freq: Four times a day (QID) | ORAL | Status: AC

## 2012-08-05 NOTE — Progress Notes (Signed)
Post discharge chart review completed.  

## 2012-08-05 NOTE — Discharge Summary (Signed)
  Cesarean Section Delivery Discharge Summary  Audrey Fox  DOB:    [birth year] MRN:    [id number] CSN:    [id number]  Date of admission:                  08/02/12  Date of discharge:                   08/05/12  Procedures this admission:  Urgent C/S under general anesthesia for NRFHT  Newborn Data:  Live born female  Birth Weight: 7 lb 7.2 oz (3380 g) APGAR: 7, 8  Home with mother.  History of Present Illness:  Ms. Audrey Fox is a 32 y.o. female, [phone], who presents at [redacted]w[redacted]d weeks gestation. The patient has been followed at the [facility] and Gynecology division of [facility].    Her pregnancy has been complicated by:  Patient Active Problem List  Diagnosis  . PPH (postpartum hemorrhage)  . Status post primary low transverse cesarean section     Hospital course:  The patient was admitted for SROM.   Her postpartum course was complicated by symptomatic anemia with blood transfusion.  She was discharged to home on postpartum day 3 doing well.  Feeding:  breast  Contraception:  Couple considering vasectomy  Discharge hemoglobin:  Hemoglobin  Date Value Range Status  08/02/2012 14.2  12.0 - 15.0 g/dL Final     HCT  Date Value Range Status  08/02/2012 39.7  36.0 - 46.0 % Final    Discharge Physical Exam:   General: alert, cooperative and no distress Lochia: appropriate Uterine Fundus: firm Incision: healing well DVT Evaluation: No evidence of DVT seen on physical exam. Negative Homan's sign.  Intrapartum Procedures: cesarean: low cervical, transverse Postpartum Procedures: NONE Complications-Operative and Postpartum: none  Discharge Diagnoses: Term Pregnancy-delivered  Discharge Information:  Activity:           per [facility] handbook Diet:                routine Medications: Ibuprofen and Percocet Condition:      stable Instructions:  refer to practice specific booklet Discharge to: home  Follow-up Information    Follow up with [facility]. Schedule an appointment as soon as possible for a visit in 6 weeks. (Call with any questions or concerns.)    Contact information:   [address] [place] [address] [phone]       Haroldine Laws CNM, MSN 08/05/2012

## 2012-08-06 LAB — TYPE AND SCREEN
Unit division: 0
Unit division: 0

## 2013-09-09 ENCOUNTER — Ambulatory Visit (INDEPENDENT_AMBULATORY_CARE_PROVIDER_SITE_OTHER): Payer: Managed Care, Other (non HMO) | Admitting: Medical

## 2013-09-09 ENCOUNTER — Encounter: Payer: Self-pay | Admitting: Medical

## 2013-09-09 VITALS — BP 110/70 | HR 60 | Temp 98.1°F | Resp 14 | Wt 128.0 lb

## 2013-09-09 DIAGNOSIS — B351 Tinea unguium: Secondary | ICD-10-CM

## 2013-09-09 NOTE — Progress Notes (Signed)
Subjective: Here as a new patient.    Been in usual state of health.  Painted her toes gold nail color a month ago.  Took off the nail color yesterday and now has dark coloration of bilat great toenail.  No other discoloration.  No prior similar.    ROS as in subjective   Objective: Gen: wd, wn, nad Skin/nails: Bilateral great toenails with slight medial ingrowing, bilateral toe nails midway down with dark brownish coloration some blackish coloration thicker nail however at the nail bed 3/5 of the nail appears to be new fresh noninfected nail growing.  THus, this would appear to be onychomycosis that is resolving on its own Toes nontender, normal ROM Feet neurovascularly    Assessment: Encounter Diagnosis  Name Primary?  Marland Kitchen. Onychomycosis of toenail Yes    Plan: At this point both of her toenails appear to have a new healthy toenail growing from behind the area that is affected.  I gave her the option of beginning Lamisil oral to help clear this up but it appears that the nail may be trying to clear on its own.   She will hold off on medication and give it time to resolve.  So recommended she work on cutting her nails straight across and avoid ingrowing of the nails.  Followup as needed

## 2013-09-11 ENCOUNTER — Other Ambulatory Visit: Payer: Self-pay | Admitting: Medical

## 2013-09-11 ENCOUNTER — Telehealth: Payer: Self-pay | Admitting: Medical

## 2013-09-11 MED ORDER — DOXYCYCLINE HYCLATE 100 MG PO TABS
100.0000 mg | ORAL_TABLET | Freq: Two times a day (BID) | ORAL | Status: AC
Start: 2013-09-11 — End: ?

## 2013-09-11 NOTE — Telephone Encounter (Signed)
Called patient and she finished the last dose of doxy last Wednesday. She had went to urgent care about 3 weeks ago for a rash from a bite from tick a week prior to the rash.  She went to her obgyn last week just for a annual check and ask her obgyn to test her for the lyme's diease.  She received the results today from her doctor that they were borderline for lyme's diease and that they wanted to see her back in 2 weeks to re-test and she would have to wait another week before she would know the results.  Patient is not happy and feels she will have the diease the rest of her life if she is not treated for the diease. Labs were fax to our office this morning.

## 2013-09-11 NOTE — Telephone Encounter (Signed)
Doxycycline sent.  Have her f/u in 3-4 wk.

## 2013-09-11 NOTE — Telephone Encounter (Signed)
I saw her for toenail issue, thought she was already taking Doxycycline or had just finished it.   Has she called the ordering physician back about the recent labs?  Do we have results?

## 2013-09-11 NOTE — Telephone Encounter (Signed)
Pt.notified

## 2013-10-01 ENCOUNTER — Encounter: Payer: Self-pay | Admitting: Medical

## 2014-03-01 ENCOUNTER — Encounter: Payer: Self-pay | Admitting: Medical

## 2014-10-26 IMAGING — US US OB FOLLOW-UP
1 series · 12 of 28 positions shown · non-contrast
Comparison: none

OBSTETRICS REPORT
                      (Signed Final 06/20/2012 [DATE])

Service(s) Provided
 US OB FOLLOW UP                                       76816.1
Indications
 2 vessel umbilical cord
 Velamentous cord insertion
Fetal Evaluation
 Num Of Fetuses:    1
 Fetal Heart Rate:  153                          bpm
 Cardiac Activity:  Observed
 Presentation:      Cephalic
 Placenta:          Fundal, above cervical os
 P. Cord            Velamentous insertion
 Insertion:
 Amniotic Fluid
 AFI FV:      Subjectively within normal limits
 AFI Sum:     20.5    cm       77  %Tile     Larg Pckt:     5.9  cm
 RUQ:   5.9     cm   RLQ:    4.56   cm    LUQ:   5.84    cm   LLQ:    4.2    cm
Biometry
 BPD:     87.6  mm     G. Age:  35w 3d                CI:         77.1   70 - 86
 OFD:    113.6  mm                                    FL/HC:      19.9   20.1 -
 HC:       320  mm     G. Age:  36w 1d       36  %    HC/AC:      1.05   0.93 -
 AC:     303.9  mm     G. Age:  34w 3d       31  %    FL/BPD:     72.7   71 - 87
 FL:      63.7  mm     G. Age:  33w 0d        3  %    FL/AC:      21.0   20 - 24
 Est. FW:    9248  gm      5 lb 4 oz     39  %
Gestational Age
 LMP:           35w 2d        Date:  10/17/11                 EDD:   07/23/12
 U/S Today:     34w 5d                                        EDD:   07/27/12
 Best:          35w 2d     Det. By:  LMP  (10/17/11)          EDD:   07/23/12
Anatomy
 Cranium:          Appears normal         Aortic Arch:      Previously seen
 Fetal Cavum:      Previously seen        Ductal Arch:      Previously seen
 Ventricles:       Appears normal         Diaphragm:        Previously seen
 Choroid Plexus:   Previously seen        Stomach:          Appears normal, left
                                                            sided
 Cerebellum:       Previously seen        Abdomen:          Appears normal
 Posterior Fossa:  Previously seen        Abdominal Wall:   Previously seen
 Nuchal Fold:      Previously seen        Cord Vessels:     2 vessel cord
 Face:             Orbits and profile     Kidneys:          Appear normal
                   previously seen
 Lips:             Previously seen        Bladder:          Appears normal
 Heart:            Appears normal         Spine:            Previously seen
                   (4CH, axis, and
                   situs)
 RVOT:             Previously seen        Lower             Previously seen
                                          Extremities:
 LVOT:             Previously seen        Upper             Previously seen
 Other:  Fetus appears to be a male. Heels and  5th digit previously seen.
Cervix Uterus Adnexa
 Cervix:       Not visualized (advanced GA >34 wks)
 Adnexa:     No abnormality visualized.
Impression
INDICATION: 31 yr old [id number] at 85w7d with fetus with single
 umbilical artery and velamentous cord insertion for fetal
 growth.

[Series 1: us ob follow-up · 0.23mm/px · 12 of 29 slices shown]
[im 2/29]
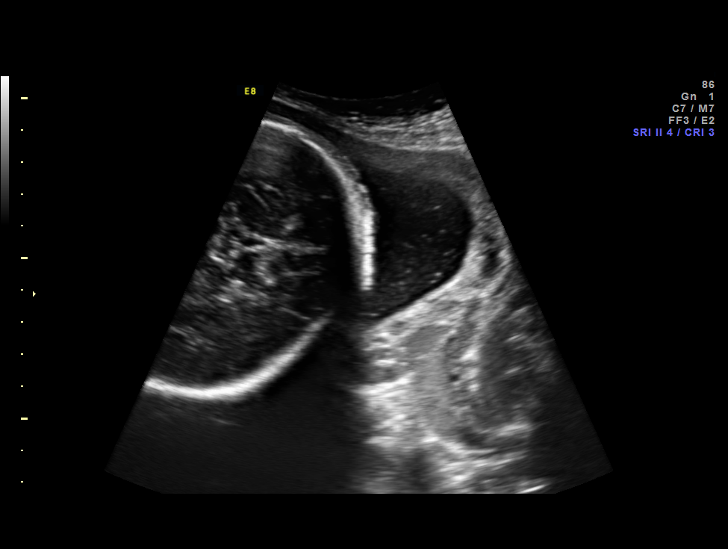
[im 4/29]
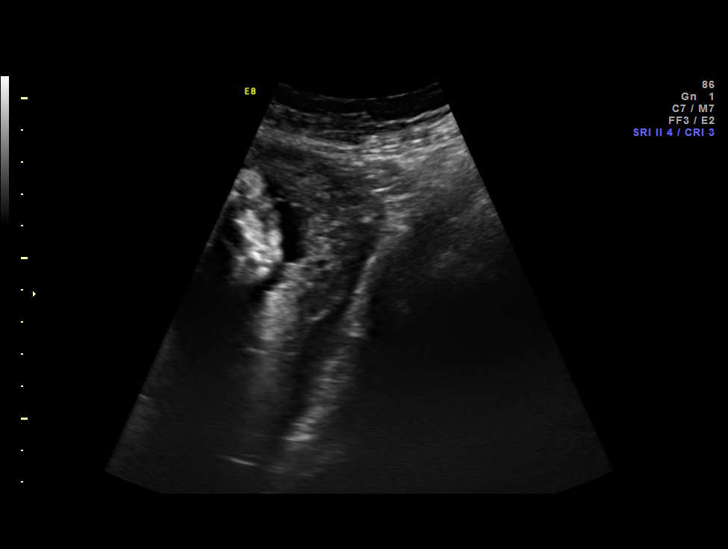
[im 6/29]
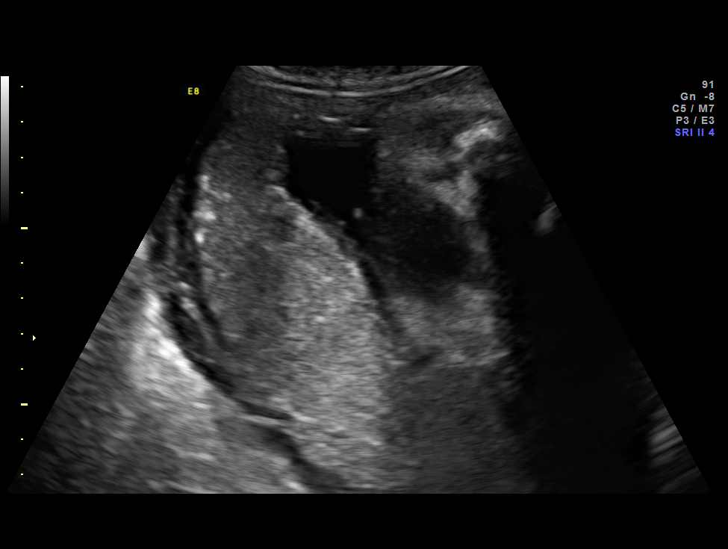
[im 9/29]
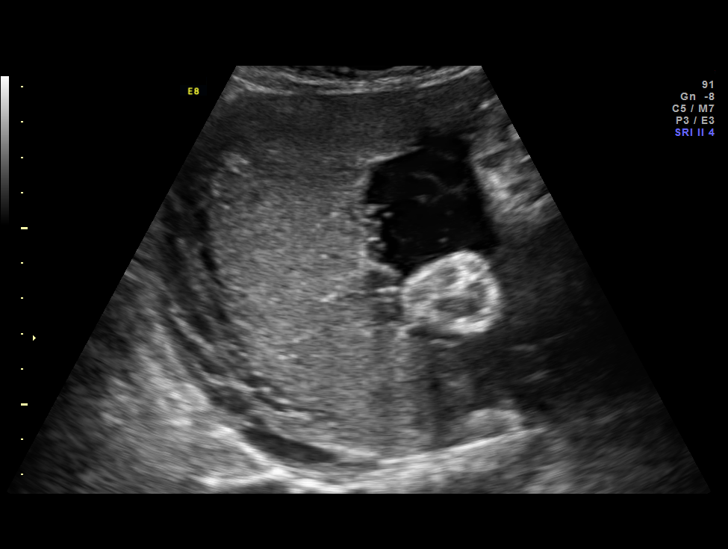
[im 11/29]
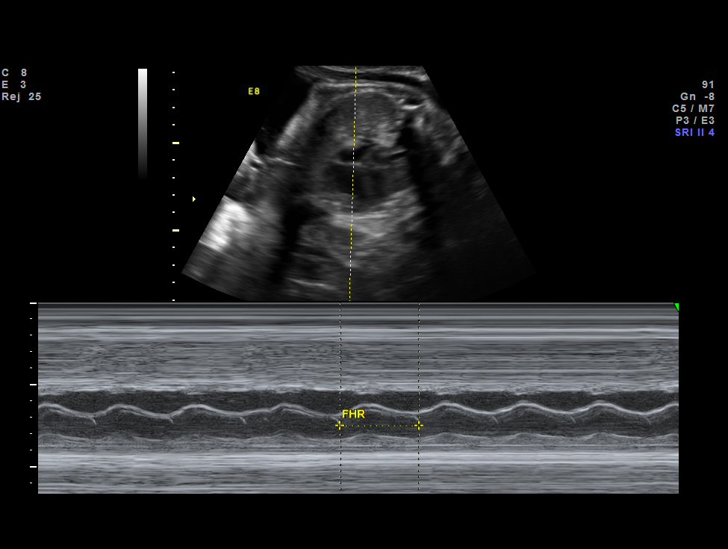
[im 13/29]
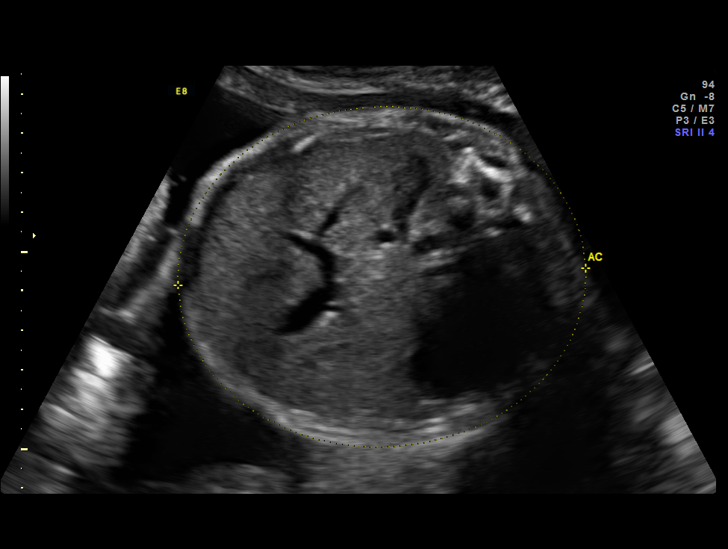
[im 16/29]
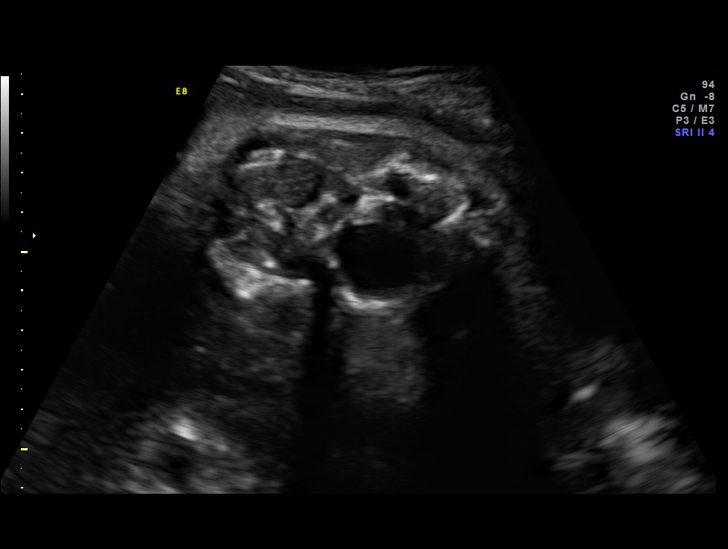
[im 18/29]
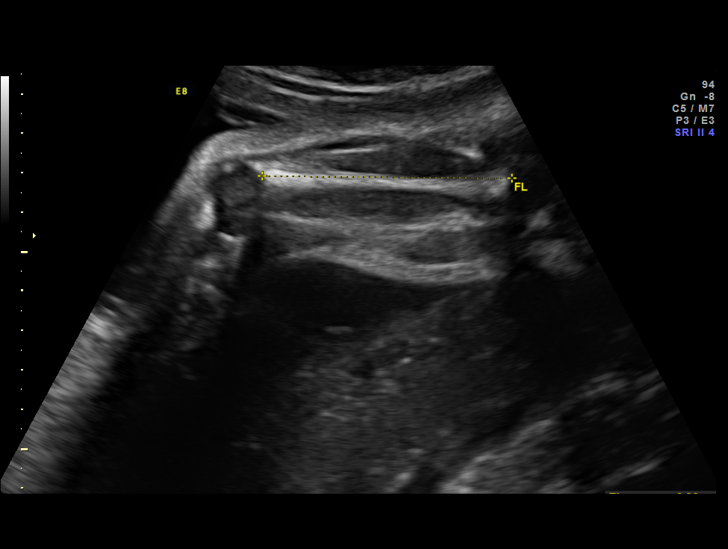
[im 20/29]
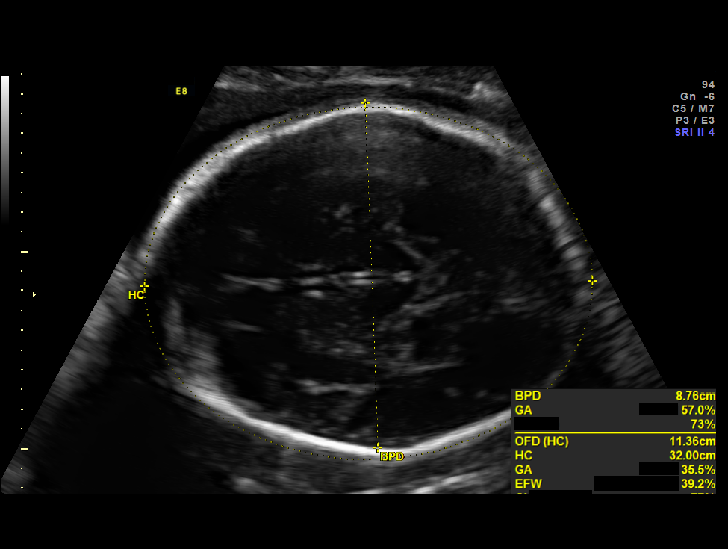
[im 23/29]
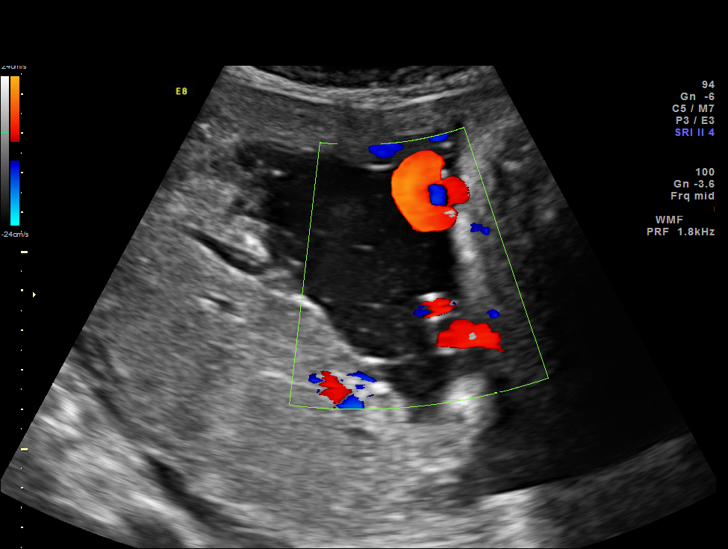
[im 25/29]
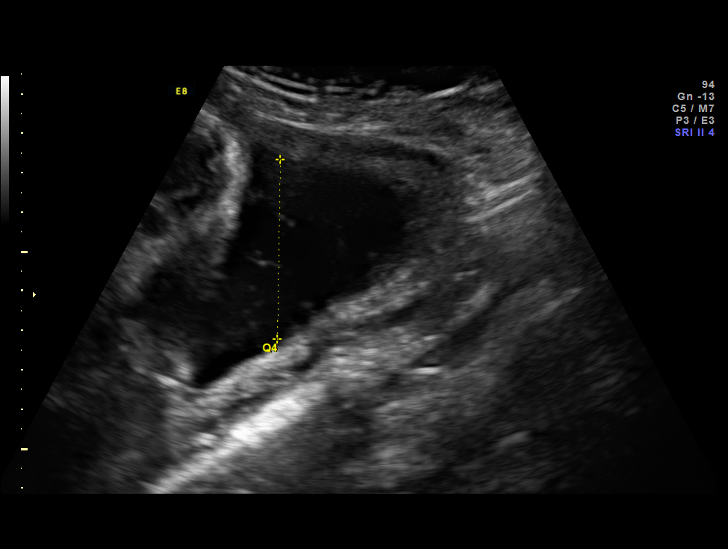
[im 27/29]
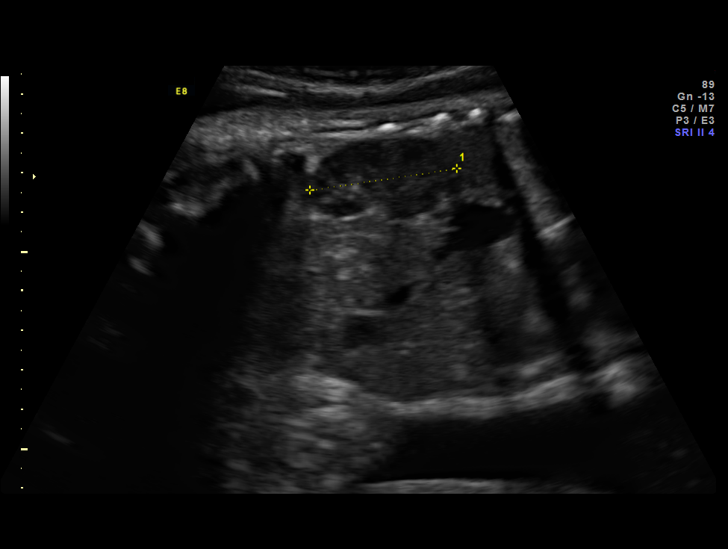

[12 of 28 positions shown; findings below may reference images not displayed]

FINDINGS: 1. Single intrauterine pregnancy.
 2. Estimated fetal weight is in the 39th%.
 3. Fundal placenta without evidence of previa.
 4. Normal amniotic fluid index.
 5. The limited anatomy survey is normal.
Recommendations

 1. Appropriate fetal growth.
 2. Velamentous cord insertion:
 - previously counseled
 - appropriate fetal growth
 - recommend fetal growth in 4 weeks
 - recommend caution at time of delivery
 3. Single umbilical artery:
 - previously counseled
 - recommend fetal growth as above
 - declined fetal echocardiogram; fetal heart appeared normal
 at the time of the anatomic survey
 4. Recommend follow up in 4 weeks
